# Patient Record
Sex: Female | Born: 1960 | Race: Black or African American | Hispanic: No | Marital: Married | Smoking: Former smoker
Health system: Southern US, Community
[De-identification: ages and names within clinical notes are randomized; demographics above are authoritative.]

## PROBLEM LIST (undated history)

## (undated) DIAGNOSIS — K219 Gastro-esophageal reflux disease without esophagitis: Secondary | ICD-10-CM

## (undated) DIAGNOSIS — D869 Sarcoidosis, unspecified: Secondary | ICD-10-CM

## (undated) DIAGNOSIS — D860 Sarcoidosis of lung: Secondary | ICD-10-CM

## (undated) DIAGNOSIS — L732 Hidradenitis suppurativa: Secondary | ICD-10-CM

## (undated) DIAGNOSIS — G589 Mononeuropathy, unspecified: Secondary | ICD-10-CM

## (undated) DIAGNOSIS — S149XXA Injury of unspecified nerves of neck, initial encounter: Secondary | ICD-10-CM

## (undated) HISTORY — PX: ABLATION ON ENDOMETRIOSIS: SHX5787

## (undated) HISTORY — PX: BREAST SURGERY: SHX581

## (undated) HISTORY — PX: LAPAROSCOPIC TUBAL LIGATION: SUR803

## (undated) HISTORY — DX: Sarcoidosis, unspecified: D86.9

## (undated) HISTORY — PX: OTHER SURGICAL HISTORY: SHX169

---

## 1996-01-30 DIAGNOSIS — D869 Sarcoidosis, unspecified: Secondary | ICD-10-CM

## 1996-01-30 HISTORY — DX: Sarcoidosis, unspecified: D86.9

## 1997-04-29 ENCOUNTER — Encounter: Payer: Self-pay | Admitting: Pulmonary Disease

## 1999-12-08 ENCOUNTER — Encounter: Payer: Self-pay | Admitting: Internal Medicine

## 1999-12-08 ENCOUNTER — Ambulatory Visit (HOSPITAL_COMMUNITY): Admission: RE | Admit: 1999-12-08 | Discharge: 1999-12-08 | Payer: Self-pay | Admitting: Internal Medicine

## 2000-01-09 ENCOUNTER — Ambulatory Visit: Admission: RE | Admit: 2000-01-09 | Discharge: 2000-01-09 | Payer: Self-pay | Admitting: Pulmonary Disease

## 2002-10-14 ENCOUNTER — Encounter: Payer: Self-pay | Admitting: Pulmonary Disease

## 2003-12-09 ENCOUNTER — Encounter: Admission: RE | Admit: 2003-12-09 | Discharge: 2003-12-09 | Payer: Self-pay | Admitting: Internal Medicine

## 2004-06-09 ENCOUNTER — Emergency Department (HOSPITAL_COMMUNITY): Admission: EM | Admit: 2004-06-09 | Discharge: 2004-06-09 | Payer: Self-pay | Admitting: Emergency Medicine

## 2004-08-10 ENCOUNTER — Encounter: Admission: RE | Admit: 2004-08-10 | Discharge: 2004-08-10 | Payer: Self-pay | Admitting: Orthopedic Surgery

## 2004-10-30 ENCOUNTER — Ambulatory Visit: Payer: Self-pay | Admitting: Pulmonary Disease

## 2004-12-16 ENCOUNTER — Encounter: Payer: Self-pay | Admitting: Pulmonary Disease

## 2004-12-19 ENCOUNTER — Ambulatory Visit: Payer: Self-pay | Admitting: Pulmonary Disease

## 2005-11-28 ENCOUNTER — Ambulatory Visit: Payer: Self-pay | Admitting: Pulmonary Disease

## 2007-01-28 ENCOUNTER — Encounter: Admission: RE | Admit: 2007-01-28 | Discharge: 2007-01-28 | Payer: Self-pay | Admitting: Obstetrics and Gynecology

## 2007-02-20 DIAGNOSIS — D869 Sarcoidosis, unspecified: Secondary | ICD-10-CM

## 2007-02-20 DIAGNOSIS — K219 Gastro-esophageal reflux disease without esophagitis: Secondary | ICD-10-CM

## 2007-02-20 HISTORY — DX: Gastro-esophageal reflux disease without esophagitis: K21.9

## 2007-02-20 HISTORY — DX: Sarcoidosis, unspecified: D86.9

## 2007-02-21 ENCOUNTER — Ambulatory Visit: Payer: Self-pay | Admitting: Pulmonary Disease

## 2007-02-28 ENCOUNTER — Ambulatory Visit: Payer: Self-pay | Admitting: Pulmonary Disease

## 2007-07-03 ENCOUNTER — Encounter: Admission: RE | Admit: 2007-07-03 | Discharge: 2007-07-03 | Payer: Self-pay | Admitting: Obstetrics and Gynecology

## 2007-11-18 ENCOUNTER — Telehealth (INDEPENDENT_AMBULATORY_CARE_PROVIDER_SITE_OTHER): Payer: Self-pay | Admitting: *Deleted

## 2008-01-12 ENCOUNTER — Encounter: Admission: RE | Admit: 2008-01-12 | Discharge: 2008-01-12 | Payer: Self-pay | Admitting: Obstetrics and Gynecology

## 2008-03-10 ENCOUNTER — Ambulatory Visit: Payer: Self-pay | Admitting: Pulmonary Disease

## 2008-04-15 ENCOUNTER — Ambulatory Visit: Payer: Self-pay | Admitting: Pulmonary Disease

## 2008-04-19 ENCOUNTER — Telehealth (INDEPENDENT_AMBULATORY_CARE_PROVIDER_SITE_OTHER): Payer: Self-pay | Admitting: *Deleted

## 2008-07-26 ENCOUNTER — Encounter: Admission: RE | Admit: 2008-07-26 | Discharge: 2008-07-26 | Payer: Self-pay | Admitting: Gastroenterology

## 2008-08-04 ENCOUNTER — Encounter: Admission: RE | Admit: 2008-08-04 | Discharge: 2008-08-04 | Payer: Self-pay | Admitting: Surgery

## 2008-08-27 ENCOUNTER — Ambulatory Visit (HOSPITAL_COMMUNITY): Admission: RE | Admit: 2008-08-27 | Discharge: 2008-08-27 | Payer: Self-pay | Admitting: Gastroenterology

## 2008-09-06 ENCOUNTER — Encounter: Admission: RE | Admit: 2008-09-06 | Discharge: 2008-09-06 | Payer: Self-pay | Admitting: Surgery

## 2008-09-10 ENCOUNTER — Encounter: Admission: RE | Admit: 2008-09-10 | Discharge: 2008-09-10 | Payer: Self-pay | Admitting: Surgery

## 2008-09-17 ENCOUNTER — Encounter: Admission: RE | Admit: 2008-09-17 | Discharge: 2008-09-17 | Payer: Self-pay | Admitting: Surgery

## 2008-09-21 ENCOUNTER — Other Ambulatory Visit: Admission: RE | Admit: 2008-09-21 | Discharge: 2008-09-21 | Payer: Self-pay | Admitting: Radiology

## 2008-09-21 ENCOUNTER — Encounter: Admission: RE | Admit: 2008-09-21 | Discharge: 2008-09-21 | Payer: Self-pay | Admitting: Surgery

## 2008-09-21 ENCOUNTER — Encounter (INDEPENDENT_AMBULATORY_CARE_PROVIDER_SITE_OTHER): Payer: Self-pay | Admitting: Radiology

## 2008-11-16 ENCOUNTER — Ambulatory Visit: Payer: Self-pay | Admitting: Pulmonary Disease

## 2009-03-17 ENCOUNTER — Ambulatory Visit: Payer: Self-pay | Admitting: Pulmonary Disease

## 2009-04-07 ENCOUNTER — Ambulatory Visit: Payer: Self-pay | Admitting: Pulmonary Disease

## 2009-09-12 ENCOUNTER — Ambulatory Visit (HOSPITAL_COMMUNITY): Admission: RE | Admit: 2009-09-12 | Discharge: 2009-09-12 | Payer: Self-pay | Admitting: Obstetrics and Gynecology

## 2009-10-04 ENCOUNTER — Encounter: Admission: RE | Admit: 2009-10-04 | Discharge: 2009-10-04 | Payer: Self-pay | Admitting: Surgery

## 2009-11-14 ENCOUNTER — Ambulatory Visit (HOSPITAL_BASED_OUTPATIENT_CLINIC_OR_DEPARTMENT_OTHER): Admission: RE | Admit: 2009-11-14 | Discharge: 2009-11-14 | Payer: Self-pay | Admitting: Surgery

## 2009-12-29 ENCOUNTER — Ambulatory Visit: Payer: Self-pay | Admitting: Pulmonary Disease

## 2010-02-27 ENCOUNTER — Telehealth: Payer: Self-pay | Admitting: Pulmonary Disease

## 2010-02-28 NOTE — Assessment & Plan Note (Signed)
Summary: flu shot ///kp  Nurse Visit   Allergies: 1)  ! Codeine  Orders Added: 1)  Admin 1st Vaccine [90471] 2)  Flu Vaccine 36yrs + [16109] Flu Vaccine Consent Questions     Do you have a history of severe allergic reactions to this vaccine? no    Any prior history of allergic reactions to egg and/or gelatin? no    Do you have a sensitivity to the preservative Thimersol? no    Do you have a past history of Guillan-Barre Syndrome? no    Do you currently have an acute febrile illness? no    Have you ever had a severe reaction to latex? no    Vaccine information given and explained to patient? yes    Are you currently pregnant? no    Lot Number:[id number]   Exp Date:07/29/2010   Site Given  Left Deltoid IM   Tammy Scott  December 29, 2009 1:34 PM

## 2010-02-28 NOTE — Assessment & Plan Note (Signed)
Summary: yearly f/u for sarcoidosis   Copy to:  [facility] Primary Provider/Referring Provider:  Kellie Shropshire  CC:  Pt is here for a 1 yr f/u appt.  Pt states breathing has "changed."  Pt c/o increased sob with exertion.  Pt states she recently had cpx with pcp and also had lab work and ACE level was 102.  Pt also c/o non-productive cough.  .  History of Present Illness: The pt is a 50y/o female who comes in today for f/u of her known sarcoid.  We have been treating her conservatively with ICS, and following yearly pfts/cxr.  She comes in for her yearly visit, and has noted a worsening in her exertional tolerance above her usual baseline.  She denies any significant cough, but does have a mild dry cough.  She tells me that she has had an ACE level done which was over 100.  She denies any constitutional symptoms or significant joint aches.  She has had an endoscopy where they saw "bumps", and I am in the process of getting records from Dr. Loreta Ave.    Current Medications (verified): 1)  Advair Diskus 250-50 Mcg/dose Misc (Fluticasone-Salmeterol) .... Inhale 1 Puff As Directed Twice A Day 2)  Prevacid 24hr 15 Mg Cpdr (Lansoprazole) .... Take 1 Tablet By Mouth Once A Day As Needed 3)  Claritin-D 12 Hour 5-120 Mg Xr12h-Tab (Loratadine-Pseudoephedrine) .... As Needed 4)  Proair Inhaler .... 2 Puffs Every 4 Hrs If Needed. 5)  Lovaza 1 Gm Caps (Omega-3-Acid Ethyl Esters) .... Take 1 Tablet By Mouth Two Times A Day 6)  Folast 2.8-25-2 Mg Tabs (L-Methylfolate-B6-B12) .... Take 1 Tablet By Mouth Once A Day  Allergies (verified): 1)  ! Codeine  Review of Systems      See HPI  Vital Signs:  Patient profile:   51 year old female Height:      68 inches Weight:      171.13 pounds BMI:     26.11 Cuff size:   regular  Vitals Entered By: Arman Filter LPN (March 17, 2009 10:19 AM)  O2 Flow:  Room air CC: Pt is here for a 1 yr f/u appt.  Pt states breathing has "changed."  Pt c/o increased sob with  exertion.  Pt states she recently had cpx with pcp and also had lab work and ACE level was 102.  Pt also c/o non-productive cough.   Comments Medications reviewed with patient Arman Filter LPN  March 17, 2009 10:19 AM    Physical Exam  General:  wd female in nad Nose:  no obvious discharge Lungs:  totally clear to ascultation Heart:  rrr, no mrg Extremities:  no edema noted or cyanosis Neurologic:  alert and oriented, moves all 4.   Impression & Recommendations:  Problem # 1:  SARCOIDOSIS, PULMONARY (ICD-135) the pt has known sarcoidosis with stable pfts and cxr over the years on ICS.  She is now having increased doe that may be related to sarcoid and may not.  She is due for her f/u pfts and cxr, and I would like to see these before making any changes in her treatment.  Would also like to review GI records.  If there is significant change, she will need a course of prednisone.  If no change, she will need to decide if her symptoms are significant enough to justify the side effects of prednisone.  I will call her with results.  Medications Added to Medication List This Visit: 1)  Prevacid 24hr 15  Mg Cpdr (Lansoprazole) .... Take 1 tablet by mouth once a day as needed 2)  Claritin-d 12 Hour 5-120 Mg Xr12h-tab (Loratadine-pseudoephedrine) .... As needed 3)  Proair Inhaler  .... 2 puffs every 4 hrs if needed. 4)  Proair Hfa 108 (90 Base) Mcg/act Aers (Albuterol sulfate) .Marland Kitchen.. 1-2 puffs every 4-6 hours as needed 5)  Lovaza 1 Gm Caps (Omega-3-acid ethyl esters) .... Take 1 tablet by mouth two times a day 6)  Folast 2.8-25-2 Mg Tabs (L-methylfolate-b6-b12) .... Take 1 tablet by mouth once a day  Other Orders: Est. Patient Level III (16109) T-2 View CXR (71020TC) Pulmonary Referral (Pulmonary)  Patient Instructions: 1)  will check cxr today and schedule for pfts.  I will call you with results. 2)  will get path report from Dr Loreta Ave   Prescriptions: PROAIR HFA 108 (90 BASE) MCG/ACT   AERS (ALBUTEROL SULFATE) 1-2 puffs every 4-6 hours as needed  #3 x 3   Entered by:   Arman Filter LPN   Authorized by:   Barbaraann Share MD   Signed by:   Arman Filter LPN on 60/45/4098   Method used:   Electronically to        [facility]* (mail-order)             ,          Ph: [phone]       Fax: [phone]   RxID:   [id number] ADVAIR DISKUS 250-50 MCG/DOSE MISC (FLUTICASONE-SALMETEROL) Inhale 1 puff as directed twice a day  #3 x 3   Entered by:   Arman Filter LPN   Authorized by:   Barbaraann Share MD   Signed by:   Arman Filter LPN on 11/25/2534   Method used:   Electronically to        [facility] [facility]* (mail-order)             ,          Ph: [phone]       Fax: [phone]   RxID:   [id number]

## 2010-02-28 NOTE — Assessment & Plan Note (Signed)
Summary: rov for sarcoidosis   Copy to:  [facility] Primary Provider/Referring Provider:  Kellie Shropshire  CC:  Pt is here for a f/u appt to discuss PFT results.   Pt states breathing is unchanged from last visit.  Marland Kitchen  History of Present Illness: The pt comes in today to review her cxr and pft results.  She was found to have no obstruction, mild restriction, and a mild decrease in DLCO.  There was no change from one year ago.  Her cxr was also completely stable.  I have reviewed the studies with her in detail, and answered all of her questions.  Medications Prior to Update: 1)  Advair Diskus 250-50 Mcg/dose Misc (Fluticasone-Salmeterol) .... Inhale 1 Puff As Directed Twice A Day 2)  Prevacid 24hr 15 Mg Cpdr (Lansoprazole) .... Take 1 Tablet By Mouth Once A Day As Needed 3)  Claritin-D 12 Hour 5-120 Mg Xr12h-Tab (Loratadine-Pseudoephedrine) .... As Needed 4)  Proair Hfa 108 (90 Base) Mcg/act  Aers (Albuterol Sulfate) .Marland Kitchen.. 1-2 Puffs Every 4-6 Hours As Needed 5)  Lovaza 1 Gm Caps (Omega-3-Acid Ethyl Esters) .... Take 1 Tablet By Mouth Two Times A Day 6)  Folast 2.8-25-2 Mg Tabs (L-Methylfolate-B6-B12) .... Take 1 Tablet By Mouth Once A Day  Allergies (verified): 1)  ! Codeine  Vital Signs:  Patient profile:   50 year old female Height:      69 inches Weight:      171 pounds BMI:     25.34 O2 Sat:      96 % on Room air Temp:     98.1 degrees F oral Pulse rate:   90 / minute BP sitting:   120 / 84  (right arm) Cuff size:   regular  Vitals Entered By: Arman Filter LPN (April 07, 2009 9:54 AM)  O2 Flow:  Room air CC: Pt is here for a f/u appt to discuss PFT results.   Pt states breathing is unchanged from last visit.   Comments Medications reviewed with patient Arman Filter LPN  April 07, 2009 9:55 AM    Physical Exam  General:  wd female in nad   Impression & Recommendations:  Problem # 1:  SARCOIDOSIS, PULMONARY (ICD-135) the pt's cxr and pfts are completely stable from a year  ago.  The pt does not feel that any of her symptoms are signficant enough at this time to justify prednisone.  I would agree.  She will f/u with me in one year, or sooner if symptoms worsen.  Other Orders: Est. Patient Level II (04540)  Patient Instructions: 1)  followup with me in one year.  Please remind [place] a month ahead of time to schedule for full breathing studies and cxr the day of your apptm.

## 2010-02-28 NOTE — Miscellaneous (Signed)
Summary: Orders Update pft charges  Clinical Lists Changes  Orders: Added new Service order of Carbon Monoxide diffusing w/capacity (94720) - Signed Added new Service order of Lung Volumes (94240) - Signed Added new Service order of Spirometry (Pre & Post) (94060) - Signed 

## 2010-03-08 NOTE — Progress Notes (Signed)
Summary: wants to know if pft and xrays can be scheduled-  Phone Note Call from Patient   Caller: Patient Call For: Stefanie Orozco Summary of Call: Patient phoned to schedule her 1 year rov stated that she usually has to have a chest xray and PFT wants to know if she can do this all the same day instead of having to come back another day. I have her follow up scheduled for 04/10/10 at 3:30pm. Ms Gierke [phone] Initial call taken by: Vedia Coffer,  February 27, 2010 2:22 PM  Follow-up for Phone Call        Pt had last PFT done March 2011.  She has ov coming up next month and wants to know if she needs to have PFTs done again. Pls advise thanks! Follow-up by: Vernie Murders,  February 27, 2010 3:51 PM  Additional Follow-up for Phone Call Additional follow up Details #1::        see if we can get her scheduled for full pfts same day as ov Additional Follow-up by: Barbaraann Share MD,  February 27, 2010 5:17 PM    Additional Follow-up for Phone Call Additional follow up Details #2::    LMTCBx1 there are appts available for pft and appt that day. Carron Curie CMA  February 27, 2010 5:24 PM  lmomtcb x2 Carver Fila  February 28, 2010 5:16 PM   spoke with pt and pft set for 04-10-10 at 1 pm and appt with [facility] at 2:30pm.Jennifer [facility] CMA  February 28, 2010 5:27 PM

## 2010-03-23 ENCOUNTER — Telehealth (INDEPENDENT_AMBULATORY_CARE_PROVIDER_SITE_OTHER): Payer: Self-pay | Admitting: *Deleted

## 2010-03-28 NOTE — Progress Notes (Signed)
Summary: samples  Phone Note Call from Patient Call back at Work Phone [phone]   Caller: Patient Call For: clance Summary of Call: Pt wants samples of advair diskus 250-52mcg. Initial call taken by: Darletta Moll,  March 23, 2010 8:27 AM  Follow-up for Phone Call        Spoke with pt.  She is requesting sample of advair while waiting for her rx to come in the mail.  1 box left up front. Reminded her to keep pending appt and she verbalized understanding.  Follow-up by: Vernie Murders,  March 23, 2010 9:39 AM

## 2010-04-10 ENCOUNTER — Encounter (INDEPENDENT_AMBULATORY_CARE_PROVIDER_SITE_OTHER): Payer: 59

## 2010-04-10 ENCOUNTER — Encounter: Payer: Self-pay | Admitting: Pulmonary Disease

## 2010-04-10 ENCOUNTER — Other Ambulatory Visit: Payer: Self-pay | Admitting: Pulmonary Disease

## 2010-04-10 ENCOUNTER — Ambulatory Visit: Payer: Self-pay | Admitting: Pulmonary Disease

## 2010-04-10 ENCOUNTER — Ambulatory Visit (INDEPENDENT_AMBULATORY_CARE_PROVIDER_SITE_OTHER)
Admission: RE | Admit: 2010-04-10 | Discharge: 2010-04-10 | Disposition: A | Discharge status: Home / Self Care | Payer: 59 | Source: Ambulatory Visit | Attending: Pulmonary Disease | Admitting: Pulmonary Disease

## 2010-04-10 ENCOUNTER — Ambulatory Visit (INDEPENDENT_AMBULATORY_CARE_PROVIDER_SITE_OTHER): Payer: Managed Care, Other (non HMO) | Admitting: Pulmonary Disease

## 2010-04-10 DIAGNOSIS — D869 Sarcoidosis, unspecified: Secondary | ICD-10-CM

## 2010-04-12 LAB — POCT HEMOGLOBIN-HEMACUE: Hemoglobin: 13.7 g/dL (ref 12.0–15.0)

## 2010-04-14 LAB — CBC
HCT: 38.3 % (ref 36.0–46.0)
Hemoglobin: 12.8 g/dL (ref 12.0–15.0)
MCH: 33.9 pg (ref 26.0–34.0)
MCHC: 33.4 g/dL (ref 30.0–36.0)
MCV: 101.5 fL — ABNORMAL HIGH (ref 78.0–100.0)
Platelets: 283 10*3/uL (ref 150–400)
RBC: 3.78 MIL/uL — ABNORMAL LOW (ref 3.87–5.11)
RDW: 13.1 % (ref 11.5–15.5)
WBC: 4.1 10*3/uL (ref 4.0–10.5)

## 2010-04-18 NOTE — Letter (Signed)
Summary: [employer]  [address]   [place], [place] [address]   Phone: [phone]  Fax: [phone]    Today's Date: April 10, 2010  Name of Patient: Stefanie Orozco  The above named patient had a medical visit today at: 2:30 pm.  Please take this into consideration when reviewing the time away from work/school.    Special Instructions:  [ x ] None  [  ] To be off the remainder of today, returning to the normal work / school schedule tomorrow.  [  ] To be off until the next scheduled appointment on ______________________.  [  ] Other ________________________________________________________________ ________________________________________________________________________   Sincerely yours,     Marcelyn Bruins, MD

## 2010-04-18 NOTE — Assessment & Plan Note (Signed)
Summary: pft charges   Allergies: 1)  Codeine   Other Orders: Carbon Monoxide diffusing w/capacity (94729) Lung Volumes/Gas dilution or washout (94727) Spirometry (Pre & Post) (94060) 

## 2010-04-18 NOTE — Assessment & Plan Note (Signed)
Summary: rov for sarcoidosis    Primary Provider/Referring Provider:  Kellie Shropshire  CC:  1 year f/u appt.  Pt had PFTs and CXR prior to appt.   Pt states she was dx with a skin condition called hydronitis.   Pt denies sob or cough.  Pt does c/o difficulty swallowing.  Pt requesting 90 day supply of Advair and Proair. Marland Kitchen  History of Present Illness: the pt comes in today for f/u of her known sarcoidosis.  She is maintained on advair for sarcoid induced cough, and has done well with this over the years.  She is having some swallowing issues, and wonder if this is due to particle size of advair?  She had a f/u cxr today that is completely stable, and had pfts which show stable numbers as well.  She denies any change in her exertional tolerance, and has no ongoing congestion or cough.  Preventive Screening-Counseling & Management  Alcohol-Tobacco     Smoking Status: quit  Current Medications (verified): 1)  Advair Diskus 250-50 Mcg/dose Misc (Fluticasone-Salmeterol) .... Inhale 1 Puff As Directed Twice A Day 2)  Prevacid 24hr 15 Mg Cpdr (Lansoprazole) .... Take 1 Tablet By Mouth Once A Day As Needed 3)  Claritin-D 12 Hour 5-120 Mg Xr12h-Tab (Loratadine-Pseudoephedrine) .... As Needed 4)  Proair Hfa 108 (90 Base) Mcg/act  Aers (Albuterol Sulfate) .Marland Kitchen.. 1-2 Puffs Every 4-6 Hours As Needed 5)  Lovaza 1 Gm Caps (Omega-3-Acid Ethyl Esters) .... Take 1 Tablet By Mouth Two Times A Day 6)  Bioidentical Progesterone .... Once Daily 7)  Dhea  Allergies (verified): 1)  ! Codeine  Past History:  Past Medical History: sarcoid--dxed 1998 in [place] on clinical grounds.  ACE > 400, compatible cxr   Social History: Patient states former smoker.  quit smoking approx 1987. Smoking Status:  quit  Review of Systems       The patient complains of acid heartburn, indigestion, weight change, difficulty swallowing, headaches, and nasal congestion/difficulty breathing through nose.  The patient denies  shortness of breath with activity, shortness of breath at rest, productive cough, non-productive cough, coughing up blood, chest pain, irregular heartbeats, loss of appetite, abdominal pain, sore throat, tooth/dental problems, sneezing, itching, ear ache, anxiety, depression, hand/feet swelling, joint stiffness or pain, rash, change in color of mucus, and fever.    Vital Signs:  Patient profile:   50 year old female Height:      69 inches Weight:      157 pounds BMI:     23.27 O2 Sat:      99 % on Room air Temp:     97.6 degrees F oral Pulse rate:   87 / minute BP sitting:   120 / 70  (left arm) Cuff size:   regular  Vitals Entered By: Arman Filter LPN (April 10, 2010 2:03 PM)  O2 Flow:  Room air CC: 1 year f/u appt.  Pt had PFTs and CXR prior to appt.   Pt states she was dx with a skin condition called hydronitis.   Pt denies sob or cough.  Pt does c/o difficulty swallowing.  Pt requesting 90 day supply of Advair and Proair.  Comments Medications reviewed with patient Arman Filter LPN  April 10, 2010 2:03 PM    Physical Exam  General:  wd female in nad. Nose:  no purulence or discharge noted.   Lungs:  totally clear to auscultation, no wheezing Heart:  rrr,no mrg  Extremities:  no edema or cyanosis  Neurologic:  alert and oriented, moves all 4    Impression & Recommendations:  Problem # 1:  SARCOIDOSIS, PULMONARY (ICD-135) the pt is doing very well from pulmonary standpoint, with no ongoing issues.  She is on advair for sarcoid related cough, but is having some mild swallowing issues.  ? due to advair.  Will try dulera in its place for a period of time, and she will let us know if it makes a difference.  Her pfts and cxr today are totally stable.  Medications Added to Medication List This Visit: 1)  Bioidentical Progesterone  .... Once daily 2)  Dhea   Other Orders: T-2 View CXR (71020TC) Est. Patient Level III (96295)  Patient Instructions: 1)  will try dulera  100/5  2 inhalations am and pm...rinse mouth well. 2)  stay active  3)  followup with me in one year, or call if having issues.

## 2010-05-31 ENCOUNTER — Telehealth: Payer: Self-pay | Admitting: Pulmonary Disease

## 2010-05-31 NOTE — Telephone Encounter (Signed)
Did her swallowing issues improve on dulera?  That is why we changed.

## 2010-05-31 NOTE — Telephone Encounter (Signed)
Pt states she is doing well on Dulera. No noticeable improvement compared to Advair. She is requesting a 90 day supply for [facility] and Proair be sent to [facility]. Pls advise if you want pt to continue on Dulera.

## 2010-06-01 NOTE — Telephone Encounter (Signed)
Spoke w/ pt and she states the dulera did not improve her swallowing issues. Pt states she is still having same swallowing problems. Please advise Dr. Shelle Iron. Thanks  Carver Fila, CMA

## 2010-06-01 NOTE — Telephone Encounter (Signed)
Pt phoned stated she was returning a call to triage she can be reached at  [phone].Vedia Coffer

## 2010-06-01 NOTE — Telephone Encounter (Signed)
lmomtcb x1 

## 2010-06-05 MED ORDER — ALBUTEROL SULFATE HFA 108 (90 BASE) MCG/ACT IN AERS: INHALATION_SPRAY | RESPIRATORY_TRACT | Status: DC

## 2010-06-05 MED ORDER — MOMETASONE FURO-FORMOTEROL FUM 100-5 MCG/ACT IN AERO: INHALATION_SPRAY | RESPIRATORY_TRACT | Status: DC

## 2010-06-05 NOTE — Telephone Encounter (Signed)
Spoke w/ pt and she states she would like to continue on [place]. Pt states it is much more friendly user and she felt like it helped her better than the advair. Pt states she also needed her rescue inhaler sent to [facility] as well. I advised pt rx's was sent to [facility] and nothing further was needed

## 2010-06-05 NOTE — Telephone Encounter (Signed)
She had been on advair previously, and changed to dulera to see if swallowing issues improved.  If they did not, give her the choice of staying on advair vs changing to dulera.  If she wants to stay on dulera, ok to send script.

## 2010-06-14 ENCOUNTER — Telehealth: Payer: Self-pay | Admitting: Pulmonary Disease

## 2010-06-14 NOTE — Telephone Encounter (Signed)
Spoke w/ pt and she states insurance had put a hold on her medication dulera and she could not get it sent to her. Pt states the pharmacy had filed it under wrong insurance. Pt states she got it fixed and the pharmacy advised her it would take up to a week before she will get her medication. Pt states she is completely out and is wanting a sample. I asked Dr. Shelle Iron and he gave the okay if we had any to give. Advised pt 1 sample of dulera 100/5 was left upfront for her to pick up. Pt verbalized understanding and needed nothing else further  Carver Fila, CMA

## 2010-09-27 ENCOUNTER — Encounter: Payer: Self-pay | Admitting: *Deleted

## 2010-09-27 ENCOUNTER — Telehealth: Payer: Self-pay | Admitting: Pulmonary Disease

## 2010-09-27 NOTE — Telephone Encounter (Signed)
Spoke with pt and she c/o dry cough and congestion. Pt went to the outpaitent clinic and was given a pred pak and benzonatate and advised to f/u with [place]. Since [facility] if booked tomorrow pt agree'd to come in and see TP at 9:15. Nothing further was needed

## 2010-09-28 ENCOUNTER — Encounter: Payer: Self-pay | Admitting: Adult Health

## 2010-09-28 ENCOUNTER — Ambulatory Visit (INDEPENDENT_AMBULATORY_CARE_PROVIDER_SITE_OTHER): Payer: Managed Care, Other (non HMO) | Admitting: Adult Health

## 2010-09-28 ENCOUNTER — Encounter: Payer: Self-pay | Admitting: *Deleted

## 2010-09-28 VITALS — BP 114/80 | HR 80 | Temp 97.2°F | Ht 68.0 in | Wt 161.2 lb

## 2010-09-28 DIAGNOSIS — J40 Bronchitis, not specified as acute or chronic: Secondary | ICD-10-CM | POA: Insufficient documentation

## 2010-09-28 MED ORDER — AZITHROMYCIN 250 MG PO TABS: ORAL_TABLET | ORAL | Status: AC

## 2010-09-28 MED ORDER — HYDROCODONE-HOMATROPINE 5-1.5 MG/5ML PO SYRP: 5.0000 mL | ORAL_SOLUTION | Freq: Four times a day (QID) | ORAL | Status: AC | PRN

## 2010-09-28 NOTE — Assessment & Plan Note (Signed)
Slow to resolve bronchitic flare /URI w/ underlying sarcoid   Plan:  Taper Prednisone 10mg  4 tabs for 2 days, then 3 tabs for 2 days, 2 tabs for 2 days, then 1 tab for 2 days, then stop Mucinex DM Twice daily  As needed  Cough/congestion  Fluids and rest.  Claritin As needed  daily for drainage.  Salt water gargles As needed   Hydromet 1-2 tsp every 4 hrs As needed  Cough, may make you sleepy.  Zpack to have on hold if symptoms worsen with discolored mucus  Please contact office for sooner follow up if symptoms do not improve or worsen or seek emergency care

## 2010-09-28 NOTE — Patient Instructions (Signed)
Taper Prednisone 10mg  4 tabs for 2 days, then 3 tabs for 2 days, 2 tabs for 2 days, then 1 tab for 2 days, then stop Mucinex DM Twice daily  As needed  Cough/congestion  Fluids and rest.  Claritin As needed  daily for drainage.  Salt water gargles As needed   Hydromet 1-2 tsp every 4 hrs As needed  Cough, may make you sleepy.  Zpack to have on hold if symptoms worsen with discolored mucus  Please contact office for sooner follow up if symptoms do not improve or worsen or seek emergency care

## 2010-09-28 NOTE — Progress Notes (Signed)
  Subjective:    Patient ID: Stefanie Orozco, female    DOB: August 06, 1960, 50 y.o.   MRN: [id number]  HPI 50 yo female with known hx of Sarcoid sarcoid--dxed 1998 in [place] on clinical grounds. ACE > 400, compatible cxr  09/28/2010 Follow up  Presents for an acute office visit. Complains of hoarseness, dry cough, head and ear congestion, sorethroat, SOB and some wheezing for 10 days . Seen at Urgent Care on 8/27, was given Neb tx. Rx steroid taper and tessalon. No abx rx was given.  Returns today some better but still has cough. Was placed on high dose steroids at 60mg  w/ slow taper over next 10 days . This is causing her nausea and trouble sleeping. No hemoptysis or dyspnea. No leg swelling  Or rash.   Review of Systems Constitutional:   No  weight loss, night sweats,  Fevers, chills, fatigue, or  lassitude.  HEENT:   No headaches,  Difficulty swallowing,  Tooth/dental problems, or  Sore throat,                No sneezing, itching, ear ache, nasal congestion, post nasal drip,   CV:  No chest pain,  Orthopnea, PND, swelling in lower extremities, anasarca, dizziness, palpitations, syncope.   GI  No heartburn, indigestion, abdominal pain, nausea, vomiting, diarrhea, change in bowel habits, loss of appetite, bloody stools.   Resp: No shortness of breath with exertion or at rest.   No coughing up of blood.  No change in color of mucus.     No chest wall deformity  Skin: no rash or lesions.  GU: no dysuria, change in color of urine, no urgency or frequency.  No flank pain, no hematuria   MS:  No joint pain or swelling.  No decreased range of motion.  No back pain.  Psych:  No change in mood or affect. No depression or anxiety.  No memory loss.         Objective:   Physical Exam GEN: A/Ox3; pleasant , NAD, well nourished   HEENT:  [place]/AT,  EACs-clear, TMs-wnl, NOSE-clear, THROAT-clear, no lesions, no postnasal drip or exudate noted.   NECK:  Supple w/ fair ROM; no JVD; normal  carotid impulses w/o bruits; no thyromegaly or nodules palpated; no lymphadenopathy.  RESP  Coarse BS w/o, wheezes/ rales/ or rhonchi.no accessory muscle use, no dullness to percussion  CARD:  RRR, no m/r/g  , no peripheral edema, pulses intact, no cyanosis or clubbing.  GI:   Soft & nt; nml bowel sounds; no organomegaly or masses detected.  Musco: Warm bil, no deformities or joint swelling noted.   Neuro: alert, no focal deficits noted.    Skin: Warm, no lesions or rashes         Assessment & Plan:

## 2010-09-28 NOTE — Progress Notes (Signed)
vist reviewed, and agree with plan as outlined.

## 2010-10-25 ENCOUNTER — Other Ambulatory Visit: Payer: Self-pay | Admitting: Obstetrics and Gynecology

## 2010-10-25 DIAGNOSIS — Z1231 Encounter for screening mammogram for malignant neoplasm of breast: Secondary | ICD-10-CM

## 2010-11-06 ENCOUNTER — Ambulatory Visit
Admission: RE | Admit: 2010-11-06 | Discharge: 2010-11-06 | Disposition: A | Discharge status: Home / Self Care | Payer: Managed Care, Other (non HMO) | Source: Ambulatory Visit | Attending: Obstetrics and Gynecology | Admitting: Obstetrics and Gynecology

## 2010-11-06 DIAGNOSIS — Z1231 Encounter for screening mammogram for malignant neoplasm of breast: Secondary | ICD-10-CM

## 2010-12-26 ENCOUNTER — Ambulatory Visit (INDEPENDENT_AMBULATORY_CARE_PROVIDER_SITE_OTHER): Payer: Managed Care, Other (non HMO)

## 2010-12-26 DIAGNOSIS — Z23 Encounter for immunization: Secondary | ICD-10-CM

## 2011-04-09 ENCOUNTER — Encounter: Payer: Self-pay | Admitting: Pulmonary Disease

## 2011-04-09 ENCOUNTER — Ambulatory Visit (INDEPENDENT_AMBULATORY_CARE_PROVIDER_SITE_OTHER): Payer: Federal, State, Local not specified - PPO | Admitting: Pulmonary Disease

## 2011-04-09 VITALS — BP 110/80 | HR 73 | Temp 98.5°F | Ht 68.0 in | Wt 163.8 lb

## 2011-04-09 DIAGNOSIS — D869 Sarcoidosis, unspecified: Secondary | ICD-10-CM

## 2011-04-09 MED ORDER — MOMETASONE FURO-FORMOTEROL FUM 100-5 MCG/ACT IN AERO: INHALATION_SPRAY | RESPIRATORY_TRACT | Status: DC

## 2011-04-09 MED ORDER — ALBUTEROL SULFATE HFA 108 (90 BASE) MCG/ACT IN AERS: INHALATION_SPRAY | RESPIRATORY_TRACT | Status: DC

## 2011-04-09 NOTE — Progress Notes (Signed)
  Subjective:    Patient ID: Stefanie Orozco, female    DOB: [birth year], 50 y.o.   MRN: [id number]  HPI The patient comes in today for followup of her known sarcoidosis.  This is manifested primarily as cough, and she has responded well to dulera.  She has not seen any change in her exertional tolerance, and feels that her cough is well controlled.  She is having a lot of sinus issues that are being managed by her primary care physician.   Review of Systems  Constitutional: Negative for fever and unexpected weight change.  HENT: Positive for congestion, sore throat, rhinorrhea, sneezing, trouble swallowing, postnasal drip and sinus pressure. Negative for ear pain, nosebleeds and dental problem.   Eyes: Negative for redness and itching.  Respiratory: Positive for cough and wheezing. Negative for chest tightness and shortness of breath.   Cardiovascular: Negative for palpitations and leg swelling.  Gastrointestinal: Negative for nausea and vomiting.  Genitourinary: Negative for dysuria.  Musculoskeletal: Negative for joint swelling.  Skin: Negative for rash.  Neurological: Positive for headaches.  Hematological: Bruises/bleeds easily.  Psychiatric/Behavioral: Negative for dysphoric mood. The patient is not nervous/anxious.        Objective:   Physical Exam Well-developed female in no acute distress Nose without purulence or discharge noted Chest totally clear to auscultation Cardiac exam with regular rate and rhythm Lower extremities without edema, no cyanosis Alert and oriented, moves all 4 extremities.       Assessment & Plan:

## 2011-04-09 NOTE — Patient Instructions (Signed)
Continue on dulera Will see back in one year, and will do cxr and pfts same day  Please call if any change in symptoms.

## 2011-04-09 NOTE — Assessment & Plan Note (Signed)
The patient is doing well from a pulmonary standpoint on her current regimen.  Her cough is well controlled, and she has not seen any change in her exertional tolerance.  Since she is doing so well, I will hold off on a chest x-ray and PFTs at this visit, and we'll schedule for her followup next year.  She is to call me if her symptoms change in the interim.

## 2011-09-17 ENCOUNTER — Other Ambulatory Visit: Payer: Self-pay | Admitting: Obstetrics and Gynecology

## 2011-09-17 DIAGNOSIS — N644 Mastodynia: Secondary | ICD-10-CM

## 2011-09-17 DIAGNOSIS — R922 Inconclusive mammogram: Secondary | ICD-10-CM

## 2011-09-17 DIAGNOSIS — L539 Erythematous condition, unspecified: Secondary | ICD-10-CM

## 2011-09-25 ENCOUNTER — Ambulatory Visit
Admission: RE | Admit: 2011-09-25 | Discharge: 2011-09-25 | Disposition: A | Discharge status: Home / Self Care | Payer: Federal, State, Local not specified - PPO | Source: Ambulatory Visit | Attending: Obstetrics and Gynecology | Admitting: Obstetrics and Gynecology

## 2011-09-25 ENCOUNTER — Other Ambulatory Visit: Payer: Self-pay | Admitting: Obstetrics and Gynecology

## 2011-09-25 DIAGNOSIS — L539 Erythematous condition, unspecified: Secondary | ICD-10-CM

## 2011-09-25 DIAGNOSIS — N644 Mastodynia: Secondary | ICD-10-CM

## 2011-09-25 DIAGNOSIS — R922 Inconclusive mammogram: Secondary | ICD-10-CM

## 2011-11-14 ENCOUNTER — Other Ambulatory Visit: Payer: Self-pay | Admitting: Obstetrics and Gynecology

## 2011-11-14 ENCOUNTER — Ambulatory Visit (INDEPENDENT_AMBULATORY_CARE_PROVIDER_SITE_OTHER): Payer: Federal, State, Local not specified - PPO

## 2011-11-14 DIAGNOSIS — Z23 Encounter for immunization: Secondary | ICD-10-CM

## 2011-11-14 DIAGNOSIS — Z1231 Encounter for screening mammogram for malignant neoplasm of breast: Secondary | ICD-10-CM

## 2011-11-15 DIAGNOSIS — Z23 Encounter for immunization: Secondary | ICD-10-CM

## 2011-12-06 ENCOUNTER — Ambulatory Visit
Admission: RE | Admit: 2011-12-06 | Discharge: 2011-12-06 | Disposition: A | Discharge status: Home / Self Care | Payer: Federal, State, Local not specified - PPO | Source: Ambulatory Visit | Attending: Obstetrics and Gynecology | Admitting: Obstetrics and Gynecology

## 2011-12-06 DIAGNOSIS — Z1231 Encounter for screening mammogram for malignant neoplasm of breast: Secondary | ICD-10-CM

## 2012-04-07 ENCOUNTER — Other Ambulatory Visit: Payer: Self-pay | Admitting: Pulmonary Disease

## 2012-04-07 DIAGNOSIS — D869 Sarcoidosis, unspecified: Secondary | ICD-10-CM

## 2012-04-08 ENCOUNTER — Ambulatory Visit: Payer: Federal, State, Local not specified - PPO | Admitting: Pulmonary Disease

## 2012-04-25 ENCOUNTER — Other Ambulatory Visit: Payer: Self-pay | Admitting: Pulmonary Disease

## 2012-04-30 ENCOUNTER — Telehealth: Payer: Self-pay | Admitting: Pulmonary Disease

## 2012-04-30 MED ORDER — MOMETASONE FURO-FORMOTEROL FUM 100-5 MCG/ACT IN AERO: INHALATION_SPRAY | RESPIRATORY_TRACT | Status: DC

## 2012-04-30 NOTE — Telephone Encounter (Signed)
Pt came in for samples. I gave the pt one sample along with patient assistance forms to complete. I advised the pt that we would not be able to give more samples in the future. Carron Curie, CMA

## 2012-05-09 ENCOUNTER — Encounter: Payer: Federal, State, Local not specified - PPO | Admitting: Pulmonary Disease

## 2012-05-09 ENCOUNTER — Ambulatory Visit: Payer: Federal, State, Local not specified - PPO | Admitting: Pulmonary Disease

## 2012-06-04 ENCOUNTER — Ambulatory Visit (INDEPENDENT_AMBULATORY_CARE_PROVIDER_SITE_OTHER)
Admission: RE | Admit: 2012-06-04 | Discharge: 2012-06-04 | Disposition: A | Discharge status: Home / Self Care | Payer: Federal, State, Local not specified - PPO | Source: Ambulatory Visit | Attending: Pulmonary Disease | Admitting: Pulmonary Disease

## 2012-06-04 ENCOUNTER — Ambulatory Visit (INDEPENDENT_AMBULATORY_CARE_PROVIDER_SITE_OTHER): Payer: Federal, State, Local not specified - PPO | Admitting: Pulmonary Disease

## 2012-06-04 ENCOUNTER — Other Ambulatory Visit: Payer: Self-pay | Admitting: Pulmonary Disease

## 2012-06-04 ENCOUNTER — Encounter: Payer: Self-pay | Admitting: Pulmonary Disease

## 2012-06-04 VITALS — BP 110/82 | HR 73 | Temp 98.2°F | Ht 66.0 in | Wt 161.0 lb

## 2012-06-04 DIAGNOSIS — D869 Sarcoidosis, unspecified: Secondary | ICD-10-CM

## 2012-06-04 LAB — PULMONARY FUNCTION TEST

## 2012-06-04 MED ORDER — MOMETASONE FURO-FORMOTEROL FUM 100-5 MCG/ACT IN AERO: INHALATION_SPRAY | RESPIRATORY_TRACT | Status: DC

## 2012-06-04 NOTE — Progress Notes (Signed)
PFT done today. 

## 2012-06-04 NOTE — Addendum Note (Signed)
Addended by: Nita Sells on: 06/04/2012 11:26 AM   Modules accepted: Orders

## 2012-06-04 NOTE — Progress Notes (Signed)
  Subjective:    Patient ID: Stefanie Orozco, female    DOB: 06/22/1960, 52 y.o.   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  She has been very stable from a pulmonary standpoint over the last one year, and her cough is totally controlled with her dulera.  She is exercising aggressively with no significant change in her breathing.  She is satisfied with her exertional tolerance.  She's had no significant skin changes or eye issues.  She did have a chest x-ray today that is totally stable from the prior.  She also had pulmonary function studies today that overall appear very stable as well.   Review of Systems  Constitutional: Negative for fever and unexpected weight change.  HENT: Positive for ear pain ( ear itching), congestion, sneezing, postnasal drip and sinus pressure. Negative for nosebleeds, sore throat, rhinorrhea, trouble swallowing and dental problem.   Eyes: Positive for itching. Negative for redness.  Respiratory: Positive for cough ( dry cough at times). Negative for chest tightness, shortness of breath and wheezing.   Cardiovascular: Negative for palpitations and leg swelling.  Gastrointestinal: Negative for nausea and vomiting.  Genitourinary: Negative for dysuria.  Musculoskeletal: Negative for joint swelling.  Skin: Negative for rash.  Neurological: Positive for headaches.  Hematological: Does not bruise/bleed easily.  Psychiatric/Behavioral: Negative for dysphoric mood. The patient is not nervous/anxious.        Objective:   Physical Exam Well-developed female in no acute distress Nose without purulence or discharge noted Neck without lymphadenopathy or thyromegaly Chest totally clear to auscultation, no wheezing Cardiac exam with regular rate and rhythm Lower extremities without edema, no cyanosis Alert and oriented, moves all 4 extremities.       Assessment & Plan:

## 2012-06-04 NOTE — Assessment & Plan Note (Signed)
The patient is doing extremely well from a symptom standpoint, with total control of her airway symptoms on dulera.  Her chest x-ray is unchanged from prior, and her PFTs showed minimal change in total lung capacity and diffusion capacity from 2 years ago.  The patient is exercising regularly, and is totally satisfied with her exertional tolerance.

## 2012-06-04 NOTE — Patient Instructions (Addendum)
Stay on your inhaler for cough, and keep as active as possible. followup with me in one year, but call if issues.

## 2012-06-13 ENCOUNTER — Encounter: Payer: Self-pay | Admitting: Pulmonary Disease

## 2012-07-17 ENCOUNTER — Other Ambulatory Visit: Payer: Self-pay | Admitting: Pulmonary Disease

## 2012-07-17 ENCOUNTER — Other Ambulatory Visit: Payer: Self-pay | Admitting: Internal Medicine

## 2012-07-18 ENCOUNTER — Telehealth: Payer: Self-pay | Admitting: Pulmonary Disease

## 2012-07-18 MED ORDER — ALBUTEROL SULFATE HFA 108 (90 BASE) MCG/ACT IN AERS: INHALATION_SPRAY | RESPIRATORY_TRACT | Status: DC

## 2012-07-18 MED ORDER — MOMETASONE FURO-FORMOTEROL FUM 100-5 MCG/ACT IN AERO: INHALATION_SPRAY | RESPIRATORY_TRACT | Status: DC

## 2012-07-18 NOTE — Telephone Encounter (Signed)
Pt aware RX's have been sent. Nothing further was needed

## 2012-11-19 ENCOUNTER — Other Ambulatory Visit: Payer: Self-pay

## 2012-11-19 DIAGNOSIS — Z1231 Encounter for screening mammogram for malignant neoplasm of breast: Secondary | ICD-10-CM

## 2012-12-18 ENCOUNTER — Ambulatory Visit
Admission: RE | Admit: 2012-12-18 | Discharge: 2012-12-18 | Disposition: A | Discharge status: Home / Self Care | Payer: Federal, State, Local not specified - PPO | Source: Ambulatory Visit

## 2012-12-18 DIAGNOSIS — Z1231 Encounter for screening mammogram for malignant neoplasm of breast: Secondary | ICD-10-CM

## 2013-04-01 ENCOUNTER — Ambulatory Visit
Admission: RE | Admit: 2013-04-01 | Discharge: 2013-04-01 | Disposition: A | Discharge status: Home / Self Care | Payer: PRIVATE HEALTH INSURANCE | Source: Ambulatory Visit | Attending: Internal Medicine | Admitting: Internal Medicine

## 2013-04-01 ENCOUNTER — Other Ambulatory Visit: Payer: Self-pay | Admitting: Internal Medicine

## 2013-04-01 DIAGNOSIS — M2550 Pain in unspecified joint: Secondary | ICD-10-CM

## 2013-04-01 DIAGNOSIS — W19XXXA Unspecified fall, initial encounter: Secondary | ICD-10-CM

## 2013-06-05 ENCOUNTER — Ambulatory Visit (INDEPENDENT_AMBULATORY_CARE_PROVIDER_SITE_OTHER): Payer: Federal, State, Local not specified - PPO | Admitting: Pulmonary Disease

## 2013-06-05 ENCOUNTER — Encounter (INDEPENDENT_AMBULATORY_CARE_PROVIDER_SITE_OTHER): Payer: Self-pay

## 2013-06-05 ENCOUNTER — Encounter: Payer: Self-pay | Admitting: Pulmonary Disease

## 2013-06-05 VITALS — BP 120/78 | HR 81 | Temp 97.7°F | Ht 68.0 in | Wt 163.6 lb

## 2013-06-05 DIAGNOSIS — D869 Sarcoidosis, unspecified: Secondary | ICD-10-CM

## 2013-06-05 MED ORDER — ALBUTEROL SULFATE HFA 108 (90 BASE) MCG/ACT IN AERS: INHALATION_SPRAY | RESPIRATORY_TRACT | Status: DC

## 2013-06-05 MED ORDER — MOMETASONE FURO-FORMOTEROL FUM 100-5 MCG/ACT IN AERO: INHALATION_SPRAY | RESPIRATORY_TRACT | Status: DC

## 2013-06-05 NOTE — Patient Instructions (Signed)
No change in meds.  Will send in new scripts for a year.  Stay active, and call if having issues. followup with me in one year.

## 2013-06-05 NOTE — Assessment & Plan Note (Signed)
The patient is doing well from a sarcoid standpoint, and her cough is being controlled with her LABA/ICS. I've asked her to continue on her current medications, and to stay as active as possible.

## 2013-06-05 NOTE — Progress Notes (Signed)
   Subjective:    Patient ID: Stefanie Orozco, female    DOB: 01-22-1961, 53 y.o.   MRN: [id number]  HPI The patient comes in today for followup of her known sarcoidosis. She has done very well from a breathing standpoint, and has excellent exertional tolerance. She has had no issues with cough since staying on her inhaler regimen, and feels that she is maintaining her baseline.   Review of Systems  Constitutional: Negative for fever and unexpected weight change.  HENT: Negative for congestion, dental problem, ear pain, nosebleeds, postnasal drip, rhinorrhea, sinus pressure, sneezing, sore throat and trouble swallowing.   Eyes: Negative for redness and itching.  Respiratory: Negative for cough, chest tightness, shortness of breath and wheezing.   Cardiovascular: Positive for leg swelling. Negative for palpitations.  Gastrointestinal: Negative for nausea and vomiting.  Genitourinary: Negative for dysuria.  Musculoskeletal: Negative for joint swelling.  Skin: Negative for rash.  Neurological: Negative for headaches.  Hematological: Does not bruise/bleed easily.  Psychiatric/Behavioral: Negative for dysphoric mood. The patient is not nervous/anxious.        Objective:   Physical Exam Well-developed female in no acute distress Nose without purulence or discharge noted Neck without lymphadenopathy or thyromegaly Chest completely clear to auscultation, no wheezing Hands without cyanosis or obvious abnormality on the tips of the fingers Lower extremities with minimal edema, no cyanosis Alert and oriented, moves all 4 extremities.       Assessment & Plan:

## 2013-07-08 ENCOUNTER — Other Ambulatory Visit: Payer: Self-pay | Admitting: Physical Medicine and Rehabilitation

## 2013-07-08 DIAGNOSIS — M542 Cervicalgia: Secondary | ICD-10-CM

## 2013-07-13 ENCOUNTER — Other Ambulatory Visit: Payer: Federal, State, Local not specified - PPO

## 2013-07-20 ENCOUNTER — Ambulatory Visit
Admission: RE | Admit: 2013-07-20 | Discharge: 2013-07-20 | Disposition: A | Discharge status: Home / Self Care | Payer: Federal, State, Local not specified - PPO | Source: Ambulatory Visit | Attending: Physical Medicine and Rehabilitation | Admitting: Physical Medicine and Rehabilitation

## 2013-07-20 DIAGNOSIS — M542 Cervicalgia: Secondary | ICD-10-CM

## 2013-07-28 ENCOUNTER — Ambulatory Visit (INDEPENDENT_AMBULATORY_CARE_PROVIDER_SITE_OTHER): Payer: Self-pay

## 2013-07-28 ENCOUNTER — Ambulatory Visit (INDEPENDENT_AMBULATORY_CARE_PROVIDER_SITE_OTHER): Payer: Federal, State, Local not specified - PPO | Admitting: Neurology

## 2013-07-28 DIAGNOSIS — Z0289 Encounter for other administrative examinations: Secondary | ICD-10-CM

## 2013-07-28 DIAGNOSIS — R209 Unspecified disturbances of skin sensation: Secondary | ICD-10-CM

## 2013-07-28 DIAGNOSIS — M79601 Pain in right arm: Secondary | ICD-10-CM

## 2013-07-28 DIAGNOSIS — M79609 Pain in unspecified limb: Secondary | ICD-10-CM

## 2013-07-28 HISTORY — DX: Pain in right arm: M79.601

## 2013-07-28 NOTE — Procedures (Signed)
   NCS (NERVE CONDUCTION STUDY) WITH EMG (ELECTROMYOGRAPHY) REPORT   STUDY DATE: June 30th 2015 PATIENT NAME: Stefanie Orozco DOB: Aug 26, 1960 MRN: [id number]    TECHNOLOGIST: Laretta Alstrom ELECTROMYOGRAPHER: Marcial Pacas M.D.  CLINICAL INFORMATION:  53 years old right-handed female, fell on ice, braced with her right hand, has been complaining of right hand, wrist, right shoulder, right neck pain, subjective weakness On examination: Right upper extremity motor strength was felt to be normal, there was giveaway weakness because of the pain, deep tendon reflex was normal and symmetric  FINDINGS: NERVE CONDUCTION STUDY: Bilateral median, ulnar sensory and motor responses were normal  NEEDLE ELECTROMYOGRAPHY: Selected needle examination was performed at right upper extremity muscles, right cervical paraspinal muscles  Needle examination of right extensive distal commonness, triceps, biceps, deltoid, flexor carpi ulnaris, pronator teres, right first dorsal interossei was normal  There was no spontaneous activity at right cervical paraspinal muscles, right C5, 6, 7  IMPRESSION:  This is a normal study. There was no electrodiagnostic evidence of right upper extremity neuropathy, or right cervical radiculopathy.   INTERPRETING PHYSICIAN:   Marcial Pacas M.D. Ph.D. [facility] [address] [place], [place] [address] [phone]

## 2013-09-04 ENCOUNTER — Emergency Department (INDEPENDENT_AMBULATORY_CARE_PROVIDER_SITE_OTHER): Payer: Federal, State, Local not specified - PPO

## 2013-09-04 ENCOUNTER — Emergency Department
Admission: EM | Admit: 2013-09-04 | Discharge: 2013-09-04 | Disposition: A | Discharge status: Home / Self Care | Payer: Federal, State, Local not specified - PPO | Source: Home / Self Care | Attending: Family Medicine | Admitting: Family Medicine

## 2013-09-04 ENCOUNTER — Encounter: Payer: Self-pay | Admitting: Emergency Medicine

## 2013-09-04 DIAGNOSIS — IMO0002 Reserved for concepts with insufficient information to code with codable children: Secondary | ICD-10-CM

## 2013-09-04 DIAGNOSIS — D1739 Benign lipomatous neoplasm of skin and subcutaneous tissue of other sites: Secondary | ICD-10-CM

## 2013-09-04 DIAGNOSIS — R229 Localized swelling, mass and lump, unspecified: Secondary | ICD-10-CM

## 2013-09-04 DIAGNOSIS — D171 Benign lipomatous neoplasm of skin and subcutaneous tissue of trunk: Secondary | ICD-10-CM

## 2013-09-04 DIAGNOSIS — R079 Chest pain, unspecified: Secondary | ICD-10-CM

## 2013-09-04 HISTORY — DX: Hidradenitis suppurativa: L73.2

## 2013-09-04 HISTORY — DX: Sarcoidosis of lung: D86.0

## 2013-09-04 HISTORY — DX: Mononeuropathy, unspecified: G58.9

## 2013-09-04 HISTORY — DX: Injury of unspecified nerves of neck, initial encounter: S14.9XXA

## 2013-09-04 HISTORY — DX: Gastro-esophageal reflux disease without esophagitis: K21.9

## 2013-09-04 NOTE — Discharge Instructions (Signed)
Lipoma  A lipoma is a noncancerous (benign) tumor composed of fat cells. They are usually found under the skin (subcutaneous). A lipoma may occur in any tissue of the body that contains fat. Common areas for lipomas to appear include the back, shoulders, buttocks, and thighs. Lipomas are a very common soft tissue growth. They are soft and grow slowly. Most problems caused by a lipoma depend on where it is growing.  DIAGNOSIS   A lipoma can be diagnosed with a physical exam. These tumors rarely become cancerous, but radiographic studies can help determine this for certain. Studies used may include:  · Computerized X-ray scans (CT or CAT scan).  · Computerized magnetic scans (MRI).  TREATMENT   Small lipomas that are not causing problems may be watched. If a lipoma continues to enlarge or causes problems, removal is often the best treatment. Lipomas can also be removed to improve appearance. Surgery is done to remove the fatty cells and the surrounding capsule. Most often, this is done with medicine that numbs the area (local anesthetic). The removed tissue is examined under a microscope to make sure it is not cancerous. Keep all follow-up appointments with your caregiver.  SEEK MEDICAL CARE IF:   · The lipoma becomes larger or hard.  · The lipoma becomes painful, red, or increasingly swollen. These could be signs of infection or a more serious condition.  Document Released: 01/05/2002 Document Revised: 04/09/2011 Document Reviewed: 06/17/2009  ExitCare® Patient Information ©2015 [employer]. This information is not intended to replace advice given to you by your health care provider. Make sure you discuss any questions you have with your health care provider.

## 2013-09-04 NOTE — ED Notes (Signed)
Reports  noticed a mass on left lower rib area about one week ago; no known injury; no pain although tender to touch because it has been palpated numerous times.

## 2013-09-04 NOTE — ED Provider Notes (Signed)
CSN: [id number]     Arrival date & time 09/04/13  1419 History   First MD Initiated Contact with Patient 09/04/13 1613     Chief Complaint  Patient presents with  . Mass      HPI Comments: Patient discovered a subtle mass on her left lower anterior ribs about one week ago, and is not sure how long it has been there (her husband actually noticed it).  The area is not painful (but is somewhat sore because she has been palpating it so frequently).  There have been no changes in the overlying skin.  The history is provided by the patient.    Past Medical History  Diagnosis Date  . Sarcoid 1998    ACE> 400, compatible cxr  . Pulmonary sarcoidosis   . GERD (gastroesophageal reflux disease)   . Pinched nerve in neck   . Hidradenitis    Past Surgical History  Procedure Laterality Date  . Laparoscopic tubal ligation    . Ablation on endometriosis    . Laproscopic    . Breast surgery     Family History  Problem Relation Age of Onset  . Stroke Mother   . Hypertension Mother   . Hypertension Father    History  Substance Use Topics  . Smoking status: Former Smoker    Quit date: 01/29/1985  . Smokeless tobacco: Not on file  . Alcohol Use: No   OB History   Grav Para Term Preterm Abortions TAB SAB Ect Mult Living                 Review of Systems  Constitutional: Negative for fever, chills, fatigue and unexpected weight change.  HENT: Negative.   Eyes: Negative.   Respiratory: Negative for chest tightness.   Cardiovascular: Negative.   Gastrointestinal: Negative.   Genitourinary: Negative.   Musculoskeletal: Negative.   Skin: Negative for color change and rash.       Mass on left chest    Allergies  Codeine  Home Medications   Prior to Admission medications   Medication Sig Start Date End Date Taking? Authorizing Provider  albuterol (PROAIR HFA) 108 (90 BASE) MCG/ACT inhaler 1-2 puffs every 4-6 hours as needed 06/05/13   Kathee Delton, MD    fexofenadine-pseudoephedrine (ALLEGRA-D) 60-120 MG per tablet Take 1 tablet by mouth. Take one two times a day as needed      Historical Provider, MD  lansoprazole (PREVACID) 15 MG capsule Take 15 mg by mouth. Take one daily as needed    Historical Provider, MD  mometasone-formoterol [facility]) 100-5 MCG/ACT AERO 2 puffs twice a day 06/05/13   Kathee Delton, MD  omeprazole (PRILOSEC) 20 MG capsule Take by mouth Daily. 03/05/11   Historical Provider, MD   BP 131/82  Pulse 75  Temp(Src) 97.9 F (36.6 C) (Oral)  Resp 16  SpO2 99% Physical Exam  Nursing note and vitals reviewed. Constitutional: She is oriented to person, place, and time. She appears well-developed and well-nourished. No distress.  HENT:  Head: Normocephalic.  Nose: Nose normal.  Mouth/Throat: Oropharynx is clear and moist.  Eyes: Conjunctivae and EOM are normal. Pupils are equal, round, and reactive to light.  Neck: Neck supple.  Cardiovascular: Normal heart sounds.   Pulmonary/Chest: Breath sounds normal. She exhibits mass. She exhibits no tenderness, no swelling and no retraction.    Over the left anterior costal margin, as noted on diagram, is a slightly raised area measuring about 5cm by 8cm.  Palpation  reveals a soft, nontender, mass with indistinct edges, and no overlying rash, warmth, induration or fluctuance.    Abdominal: Soft. She exhibits no mass. There is no tenderness.  Musculoskeletal: She exhibits no edema.  Lymphadenopathy:    She has no cervical adenopathy.  Neurological: She is alert and oriented to person, place, and time.  Skin: Skin is warm and dry. No rash noted.    ED Course  Procedures  none     Imaging Review US Soft Tissue Head/neck  09/04/2013   CLINICAL DATA:  Painful, palpable soft tissue nodule in the left lower rib area.  EXAM: ULTRASOUND OF CHEST SOFT TISSUES  TECHNIQUE: Ultrasound examination of the chest wall soft tissues was performed in the area of clinical concern.  COMPARISON:   None.  FINDINGS: Sonographic interrogation of the region of the patient's discomfort demonstrates normal appearing subcutaneous fat, costal cartilage and ribs. No defined soft tissue mass, fluid collection or other acute abnormality.  IMPRESSION: No focal abnormality in the region of concern.   Electronically Signed   By: Jacqulynn Cadet M.D.   On: 09/04/2013 15:41     MDM   1. Mass   2. Lipoma of skin and subcutaneous tissue of trunk    Reassurance. Followup with PCP if significant changes occur    Kandra Nicolas, MD 09/08/13 [phone]

## 2013-11-26 ENCOUNTER — Other Ambulatory Visit: Payer: Self-pay

## 2013-11-26 DIAGNOSIS — Z1231 Encounter for screening mammogram for malignant neoplasm of breast: Secondary | ICD-10-CM

## 2013-12-21 ENCOUNTER — Ambulatory Visit: Payer: Federal, State, Local not specified - PPO

## 2014-02-02 ENCOUNTER — Other Ambulatory Visit: Payer: Self-pay | Admitting: Physician Assistant

## 2014-06-05 ENCOUNTER — Other Ambulatory Visit: Payer: Self-pay | Admitting: Pulmonary Disease

## 2014-06-07 ENCOUNTER — Ambulatory Visit: Payer: Federal, State, Local not specified - PPO | Admitting: Pulmonary Disease

## 2014-06-25 ENCOUNTER — Encounter (INDEPENDENT_AMBULATORY_CARE_PROVIDER_SITE_OTHER): Payer: Self-pay

## 2014-06-25 ENCOUNTER — Ambulatory Visit (INDEPENDENT_AMBULATORY_CARE_PROVIDER_SITE_OTHER): Payer: Federal, State, Local not specified - PPO | Admitting: Pulmonary Disease

## 2014-06-25 ENCOUNTER — Encounter: Payer: Self-pay | Admitting: Pulmonary Disease

## 2014-06-25 ENCOUNTER — Ambulatory Visit (INDEPENDENT_AMBULATORY_CARE_PROVIDER_SITE_OTHER)
Admission: RE | Admit: 2014-06-25 | Discharge: 2014-06-25 | Disposition: A | Discharge status: Home / Self Care | Payer: Federal, State, Local not specified - PPO | Source: Ambulatory Visit | Attending: Pulmonary Disease | Admitting: Pulmonary Disease

## 2014-06-25 VITALS — BP 124/66 | HR 71 | Temp 98.4°F | Ht 68.0 in | Wt 160.0 lb

## 2014-06-25 DIAGNOSIS — D869 Sarcoidosis, unspecified: Secondary | ICD-10-CM

## 2014-06-25 NOTE — Patient Instructions (Signed)
No change in medications. Will check chest xray today and call you with results. Will schedule for breathing studies at your convenience, and will call  followup with Dr. Lamonte Sakai in one year.

## 2014-06-25 NOTE — Progress Notes (Signed)
   Subjective:    Patient ID: Stefanie Orozco, female    DOB: 07/13/1960, 54 y.o.   MRN: [id number]  HPI The patient comes in today for follow-up of her known sarcoidosis. She has done well from a pulmonary standpoint, and enjoys a very active lifestyle. She is continuing on her Symbicort which helps her cough and shortness of breath. However, she has noticed a decline in her exertional tolerance over the last one year. It does not interfere with her lifestyle, but she notices it.   Review of Systems  Constitutional: Negative for fever and unexpected weight change.  HENT: Negative for congestion, dental problem, ear pain, nosebleeds, postnasal drip, rhinorrhea, sinus pressure, sneezing, sore throat and trouble swallowing.   Eyes: Negative for redness and itching.  Respiratory: Positive for shortness of breath and wheezing. Negative for cough and chest tightness.   Cardiovascular: Negative for palpitations and leg swelling.  Gastrointestinal: Negative for nausea and vomiting.  Genitourinary: Negative for dysuria.  Musculoskeletal: Negative for joint swelling.  Skin: Negative for rash.  Neurological: Negative for headaches.  Hematological: Does not bruise/bleed easily.  Psychiatric/Behavioral: Negative for dysphoric mood. The patient is not nervous/anxious.        Objective:   Physical Exam Well-developed female in no acute distress Nose without purulence or discharge noted Neck without lymphadenopathy or thyromegaly Chest clear to auscultation, no wheezes or crackles Cardiac exam with regular rate and rhythm Lower extremities without edema, no cyanosis Alert and oriented, moves all 4 extremities.        Assessment & Plan:

## 2014-06-25 NOTE — Assessment & Plan Note (Signed)
The patient overall feels that she is doing well from a sarcoid standpoint. Her cough and other pulmonary symptoms are fairly well-controlled with her bronchodilator regimen, but she has seen a slow decline in her exertional tolerance over the years and is due for follow-up PFTs and chest x-ray.  She continues to lead an active lifestyle, and is maintaining her weight. Will schedule for her follow-up testing, and call her with results. She is to follow-up formally in one year, or sooner if she is having worsening symptoms.

## 2014-06-30 ENCOUNTER — Telehealth: Payer: Self-pay | Admitting: Pulmonary Disease

## 2014-06-30 NOTE — Telephone Encounter (Signed)
Result Note     Let pt know that her cxr is stable.  --  I spoke with patient about results and she verbalized understanding and had no questions.

## 2014-08-12 ENCOUNTER — Encounter (INDEPENDENT_AMBULATORY_CARE_PROVIDER_SITE_OTHER): Payer: Self-pay

## 2014-08-12 ENCOUNTER — Ambulatory Visit (INDEPENDENT_AMBULATORY_CARE_PROVIDER_SITE_OTHER): Payer: Federal, State, Local not specified - PPO | Admitting: Emergency Medicine

## 2014-08-12 DIAGNOSIS — D869 Sarcoidosis, unspecified: Secondary | ICD-10-CM

## 2014-08-12 LAB — PULMONARY FUNCTION TEST
DL/VA % pred: 90 %
DL/VA: 4.55 ml/min/mmHg/L
DLCO unc % pred: 67 %
DLCO unc: 18.29 ml/min/mmHg
FEF 25-75 Post: 2.18 L/sec
FEF 25-75 Pre: 2.45 L/sec
FEF2575-%Change-Post: -11 %
FEF2575-%Pred-Post: 89 %
FEF2575-%Pred-Pre: 100 %
FEV1-%Change-Post: 0 %
FEV1-%Pred-Post: 104 %
FEV1-%Pred-Pre: 105 %
FEV1-Post: 2.51 L
FEV1-Pre: 2.53 L
FEV1FVC-%Change-Post: 2 %
FEV1FVC-%Pred-Pre: 98 %
FEV6-%Change-Post: -2 %
FEV6-%Pred-Post: 103 %
FEV6-%Pred-Pre: 106 %
FEV6-Post: 3.06 L
FEV6-Pre: 3.13 L
FEV6FVC-%Change-Post: 0 %
FEV6FVC-%Pred-Post: 103 %
FEV6FVC-%Pred-Pre: 102 %
FVC-%Change-Post: -3 %
FVC-%Pred-Post: 101 %
FVC-%Pred-Pre: 104 %
FVC-Post: 3.07 L
FVC-Pre: 3.16 L
Post FEV1/FVC ratio: 82 %
Post FEV6/FVC ratio: 100 %
Pre FEV1/FVC ratio: 80 %
Pre FEV6/FVC Ratio: 99 %
RV % pred: 59 %
RV: 1.17 L
TLC % pred: 86 %
TLC: 4.64 L

## 2014-08-12 NOTE — Progress Notes (Signed)
PFT done today. 

## 2014-09-03 ENCOUNTER — Telehealth: Payer: Self-pay | Admitting: Emergency Medicine

## 2014-09-03 NOTE — Telephone Encounter (Signed)
Spoke with pt. Nothing further needed  °

## 2014-09-03 NOTE — Telephone Encounter (Signed)
Spoke with TW. She will call the pt.

## 2014-10-07 ENCOUNTER — Telehealth: Payer: Self-pay | Admitting: Emergency Medicine

## 2014-10-07 MED ORDER — ALBUTEROL SULFATE HFA 108 (90 BASE) MCG/ACT IN AERS
INHALATION_SPRAY | RESPIRATORY_TRACT | Status: DC
Start: 2014-10-07 — End: 2015-06-14

## 2014-10-07 NOTE — Telephone Encounter (Signed)
Called pt. She needed refill on proair. I have sent this in. Nothing further needed

## 2014-10-27 DIAGNOSIS — M25549 Pain in joints of unspecified hand: Secondary | ICD-10-CM | POA: Insufficient documentation

## 2014-12-20 ENCOUNTER — Other Ambulatory Visit: Payer: Self-pay | Admitting: *Deleted

## 2015-05-19 ENCOUNTER — Other Ambulatory Visit: Payer: Self-pay | Admitting: Physical Medicine and Rehabilitation

## 2015-05-19 DIAGNOSIS — M5013 Cervical disc disorder with radiculopathy, cervicothoracic region: Secondary | ICD-10-CM

## 2015-05-19 DIAGNOSIS — M5023 Other cervical disc displacement, cervicothoracic region: Secondary | ICD-10-CM

## 2015-05-19 DIAGNOSIS — M542 Cervicalgia: Secondary | ICD-10-CM

## 2015-05-19 DIAGNOSIS — M4723 Other spondylosis with radiculopathy, cervicothoracic region: Secondary | ICD-10-CM

## 2015-05-19 DIAGNOSIS — G894 Chronic pain syndrome: Secondary | ICD-10-CM

## 2015-05-24 ENCOUNTER — Other Ambulatory Visit: Payer: Self-pay | Admitting: Physical Medicine and Rehabilitation

## 2015-05-24 DIAGNOSIS — M5002 Cervical disc disorder with myelopathy, mid-cervical region, unspecified level: Secondary | ICD-10-CM

## 2015-05-24 DIAGNOSIS — M5023 Other cervical disc displacement, cervicothoracic region: Secondary | ICD-10-CM

## 2015-05-27 ENCOUNTER — Ambulatory Visit
Admission: RE | Admit: 2015-05-27 | Discharge: 2015-05-27 | Disposition: A | Discharge status: Home / Self Care | Payer: Worker's Compensation | Source: Ambulatory Visit | Attending: Physical Medicine and Rehabilitation | Admitting: Physical Medicine and Rehabilitation

## 2015-05-27 DIAGNOSIS — M5023 Other cervical disc displacement, cervicothoracic region: Secondary | ICD-10-CM

## 2015-05-27 DIAGNOSIS — M5002 Cervical disc disorder with myelopathy, mid-cervical region, unspecified level: Secondary | ICD-10-CM

## 2015-06-14 ENCOUNTER — Encounter: Payer: Self-pay | Admitting: Emergency Medicine

## 2015-06-14 ENCOUNTER — Ambulatory Visit (INDEPENDENT_AMBULATORY_CARE_PROVIDER_SITE_OTHER): Payer: Federal, State, Local not specified - PPO | Admitting: Emergency Medicine

## 2015-06-14 VITALS — BP 114/82 | HR 78 | Ht 68.0 in | Wt 168.0 lb

## 2015-06-14 DIAGNOSIS — D869 Sarcoidosis, unspecified: Secondary | ICD-10-CM | POA: Diagnosis not present

## 2015-06-14 DIAGNOSIS — Z23 Encounter for immunization: Secondary | ICD-10-CM

## 2015-06-14 MED ORDER — ALBUTEROL SULFATE HFA 108 (90 BASE) MCG/ACT IN AERS: INHALATION_SPRAY | RESPIRATORY_TRACT | Status: DC

## 2015-06-14 MED ORDER — MOMETASONE FURO-FORMOTEROL FUM 100-5 MCG/ACT IN AERO: 2.0000 | INHALATION_SPRAY | Freq: Two times a day (BID) | RESPIRATORY_TRACT | Status: DC

## 2015-06-14 NOTE — Patient Instructions (Addendum)
Please continue your Ruthe Mannan twice a day Keep albuterol available to use 2 puffs as needed  You will need repeat PFT's and chest imaging.  Remember to get your eye exam annually Flu shot every year.  We will give the Pneumovax-23 today. You will need the Prevnar-13 in a year (May 2018) We will make sure that your records are forwarded to your new Pulmonologist in [place].

## 2015-06-14 NOTE — Addendum Note (Signed)
Addended by: Desmond Dike C on: 06/14/2015 01:39 PM   Modules accepted: Orders

## 2015-06-14 NOTE — Assessment & Plan Note (Signed)
Please continue your Ruthe Mannan twice a day Keep albuterol available to use 2 puffs as needed  You will need repeat PFT's and chest imaging.  Remember to get your eye exam annually Flu shot every year.  We will give the Pneumovax-23 today. You will need the Prevnar-13 in a year (May 2018) We will make sure that your records are forwarded to your new Pulmonologist in [place].

## 2015-06-14 NOTE — Progress Notes (Signed)
Subjective:    Patient ID: Stefanie Orozco, female    DOB: Jun 04, 1960, 55 y.o.   MRN: [id number]  HPI 55 year old woman has been followed in the past by Dr Gwenette Greet for probable sarcoidosis based on her chest x-ray and elevated ace level. She also his possible mild obstructive disease based on current flow volume loop on pulmonary function testing. The last was done July 2016. She has been on [facility] since 2014.  She has albuterol prn - uses it most mornings and sometimes w exertion. Her last CXR was 05/2014. She hasn't had a flare in several years. No skin manifestations.    Review of Systems As per HPI  Past Medical History  Diagnosis Date  . Sarcoid ([place]) 1998    ACE> 400, compatible cxr  . Pulmonary sarcoidosis ([place])   . GERD (gastroesophageal reflux disease)   . Pinched nerve in neck   . Hidradenitis      Family History  Problem Relation Age of Onset  . Stroke Mother   . Hypertension Mother   . Hypertension Father      Social History   Social History  . Marital Status: Married    Spouse Name: N/A  . Number of Children: N/A  . Years of Education: N/A   Occupational History  . Not on file.   Social History Main Topics  . Smoking status: Former Smoker    Quit date: 01/29/1985  . Smokeless tobacco: Not on file  . Alcohol Use: No  . Drug Use: No  . Sexual Activity: Not on file   Other Topics Concern  . Not on file   Social History Narrative     Allergies  Allergen Reactions  . Codeine      Outpatient Prescriptions Prior to Visit  Medication Sig Dispense Refill  . fexofenadine-pseudoephedrine (ALLEGRA-D) 60-120 MG per tablet Take 1 tablet by mouth. Take one two times a day as needed      . Multiple Vitamin (MULTIVITAMIN WITH MINERALS) TABS tablet Take 1 tablet by mouth daily.    . NON FORMULARY Lidiapincum- OTC herbal supplement for hot flashes 1 tab bid    . omeprazole (PRILOSEC) 20 MG capsule Take 40 mg by mouth Daily.     Marland Kitchen albuterol (PROAIR HFA) 108  (90 BASE) MCG/ACT inhaler 1-2 puffs every 4-6 hours as needed 1 Inhaler 6  . DULERA 100-5 MCG/ACT AERO INHALE TWO PUFFS BY MOUTH TWICE DAILY 13 Inhaler 5  . baclofen (LIORESAL) 10 MG tablet Take 10 mg by mouth 2 (two) times daily.    . Doxepin HCl 6 MG TABS Take 6 mg by mouth daily.     No facility-administered medications prior to visit.          Objective:   Physical Exam Filed Vitals:   06/14/15 1249  BP: 114/82  Pulse: 78  Height: 5\' 8"  (1.727 m)  Weight: 168 lb (76.204 kg)  SpO2: 97%   Gen: Pleasant, well-nourished, in no distress,  normal affect  ENT: No lesions,  mouth clear,  oropharynx clear, no postnasal drip  Neck: No JVD, no TMG, no carotid bruits  Lungs: No use of accessory muscles, no dullness to percussion, clear without rales or rhonchi  Cardiovascular: RRR, heart sounds normal, no murmur or gallops, no peripheral edema  Musculoskeletal: No deformities, no cyanosis or clubbing  Neuro: alert, non focal  Skin: Warm, no lesions or rashes      Assessment & Plan:  SARCOIDOSIS, PULMONARY Please continue your  Dulera twice a day Keep albuterol available to use 2 puffs as needed  You will need repeat PFT's and chest imaging.  Remember to get your eye exam annually Flu shot every year.  We will give the Pneumovax-23 today. You will need the Prevnar-13 in a year (May 2018) We will make sure that your records are forwarded to your new Pulmonologist in [place].    Baltazar Apo, MD, PhD 06/14/2015, 1:22 PM [facility] [phone] or if no answer [phone]

## 2016-01-31 ENCOUNTER — Telehealth: Payer: Self-pay | Admitting: Emergency Medicine

## 2016-01-31 NOTE — Telephone Encounter (Signed)
email was sent in for RB---pt will need documentation that she has sarcoid.  Will forward this message to [place].  Pt was last seen on 06/14/15.  Please advise if she will need another ov for this.   thanks

## 2016-01-31 NOTE — Telephone Encounter (Signed)
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 What is required is documentation from my treating physician confirming its existence. I need Dr. Lamonte Sakai to complete this document for me. If guidance is required don't hesitate to reach out to me for what elements are required within this document. [place] [phone] cellphone

## 2016-02-02 NOTE — Telephone Encounter (Signed)
RB please advise. Thanks.  

## 2016-02-07 NOTE — Telephone Encounter (Signed)
Pt states a letter documenting her dx's will work. Pt states she is currently  in [place] for work, and ask that we mail the letter to [employer] [id number] [place], [id number].  RB please advise. Thanks.

## 2016-02-07 NOTE — Telephone Encounter (Signed)
I believe we can fill out the paperwork she needs based on current notes. Please ask her what we need to fill out

## 2016-02-07 NOTE — Telephone Encounter (Signed)
lmtcb X1 for pt to request clarification- is a form needing to be filled out, or just a letter documenting pt's diagnosis.

## 2016-02-08 ENCOUNTER — Encounter: Payer: Self-pay | Admitting: Emergency Medicine

## 2016-02-08 NOTE — Progress Notes (Signed)
Letter written, needs to be printed and faxed to requested # Thanks

## 2016-02-08 NOTE — Telephone Encounter (Signed)
Letter has been printed and signed. It has been placed in the mail to the address pt requested. Pt is aware. Nothing further was needed.

## 2016-02-08 NOTE — Telephone Encounter (Signed)
I wrote a letter, needs to be printed. Thanks.

## 2016-05-30 ENCOUNTER — Telehealth: Payer: Self-pay | Admitting: Emergency Medicine

## 2016-05-30 NOTE — Telephone Encounter (Signed)
Pt is seeing RB on 6/29- per 06/14/2015 AVS pt was to follow up with PFT and cxr.  It appears that pt was not planning on following up with our clinic so no order for pft was ordered.  RB ok to order PFT to be performed prior to OV?  Thanks!

## 2016-05-31 NOTE — Telephone Encounter (Signed)
Called and spoke to pt. Pt states she is only able to come in on 6.29.2018 for her appt and her PFT. Will need to wait to schedule the PFT until early June where the 2nd PFT schedule is opened.   Will close message and forward to myself and Joellen Jersey to follow.

## 2016-05-31 NOTE — Telephone Encounter (Signed)
Yes thanks - full PFT

## 2016-06-13 NOTE — Telephone Encounter (Signed)
LMTCB for pt.   PFT has been scheduled for 07/27/16 at 2pm, then OV with RB is on 07/27/16 at 3pm.

## 2016-07-26 ENCOUNTER — Other Ambulatory Visit: Payer: Self-pay | Admitting: Emergency Medicine

## 2016-07-26 DIAGNOSIS — D869 Sarcoidosis, unspecified: Secondary | ICD-10-CM

## 2016-07-26 NOTE — Addendum Note (Signed)
Addended by: Tyson Dense on: 07/26/2016 05:18 PM   Modules accepted: Orders

## 2016-07-27 ENCOUNTER — Ambulatory Visit (INDEPENDENT_AMBULATORY_CARE_PROVIDER_SITE_OTHER)
Admission: RE | Admit: 2016-07-27 | Discharge: 2016-07-27 | Disposition: A | Discharge status: Home / Self Care | Payer: Federal, State, Local not specified - PPO | Source: Ambulatory Visit | Attending: Emergency Medicine | Admitting: Emergency Medicine

## 2016-07-27 ENCOUNTER — Ambulatory Visit (INDEPENDENT_AMBULATORY_CARE_PROVIDER_SITE_OTHER): Payer: Federal, State, Local not specified - PPO | Admitting: Emergency Medicine

## 2016-07-27 ENCOUNTER — Encounter: Payer: Self-pay | Admitting: Emergency Medicine

## 2016-07-27 DIAGNOSIS — D869 Sarcoidosis, unspecified: Secondary | ICD-10-CM | POA: Diagnosis not present

## 2016-07-27 LAB — PULMONARY FUNCTION TEST
DL/VA % pred: 83 %
DL/VA: 4.27 ml/min/mmHg/L
DLCO COR % PRED: 59 %
DLCO cor: 16.39 ml/min/mmHg
DLCO unc % pred: 58 %
DLCO unc: 16.19 ml/min/mmHg
FEF 25-75 POST: 2.57 L/s
FEF 25-75 Pre: 2.56 L/sec
FEF2575-%Change-Post: 0 %
FEF2575-%Pred-Post: 107 %
FEF2575-%Pred-Pre: 106 %
FEV1-%CHANGE-POST: 0 %
FEV1-%Pred-Post: 101 %
FEV1-%Pred-Pre: 100 %
FEV1-POST: 2.44 L
FEV1-Pre: 2.43 L
FEV1FVC-%CHANGE-POST: 0 %
FEV1FVC-%Pred-Pre: 102 %
FEV6-%Change-Post: 0 %
FEV6-%PRED-PRE: 100 %
FEV6-%Pred-Post: 100 %
FEV6-PRE: 2.97 L
FEV6-Post: 2.99 L
FEV6FVC-%PRED-PRE: 103 %
FEV6FVC-%Pred-Post: 103 %
FVC-%CHANGE-POST: 0 %
FVC-%PRED-POST: 97 %
FVC-%PRED-PRE: 97 %
FVC-POST: 2.99 L
FVC-PRE: 2.97 L
POST FEV6/FVC RATIO: 100 %
Post FEV1/FVC ratio: 82 %
Pre FEV1/FVC ratio: 82 %
Pre FEV6/FVC Ratio: 100 %
RV % pred: 52 %
RV: 1.06 L
TLC % PRED: 75 %
TLC: 4.09 L

## 2016-07-27 MED ORDER — ALBUTEROL SULFATE HFA 108 (90 BASE) MCG/ACT IN AERS: INHALATION_SPRAY | RESPIRATORY_TRACT | 11 refills | Status: DC

## 2016-07-27 NOTE — Progress Notes (Signed)
PFT done today. 

## 2016-07-27 NOTE — Assessment & Plan Note (Signed)
Stable at this time. No rash. No eye manifestations or neuro sx.   Please continue Dulera as you are taking it Continue albuterol 2 puffs as needed Chest x-ray today Your breathing tests are stable compared with July 2016 Follow with Dr Lamonte Sakai in 12 months or sooner if you have any problems

## 2016-07-27 NOTE — Progress Notes (Signed)
Subjective:    Patient ID: Stefanie Orozco, female    DOB: 1960/11/24, 56 y.o.   MRN: [id number]  HPI 56 year old woman has been followed in the past by Dr Stefanie Orozco for probable sarcoidosis based on her chest x-ray and elevated ace level. She also his possible mild obstructive disease based on current flow volume loop on pulmonary function testing. The last was done July 2016. She has been on [facility] since 2014.  She has albuterol prn - uses it most mornings and sometimes w exertion. Her last CXR was 05/2014. She hasn't had a flare in several years. No skin manifestations.   ROV 07/27/16 -- Patient has a history of probable sarcoidosis based on radiology and an elevated Ace level. She underwent pulmonary function testing today that I have reviewed. This shows evidence primarily for restriction both on spirometry and on lung volumes. There may be some subtle evidence for coexisting obstruction based on the curve of her flow volume loop. Diffusion capacity is decreased but corrects to the normal range when adjusted for alveolar volume. No significant change from her prior PFTs in 2016. She is on Dulera 1 puff bid, uses ProAir about once a day, first thing in the am. No prednisone since last time. She has been treated for sinusitis since last time, usually during the pollen season. Exertional tolerance is good. No cough, no wheeze. No rash. She sees opthamologist annually.    Review of Systems As per HPI  Past Medical History:  Diagnosis Date  . GERD (gastroesophageal reflux disease)   . Hidradenitis   . Pinched nerve in neck   . Pulmonary sarcoidosis ([place])   . Sarcoid 1998   ACE> 400, compatible cxr     Family History  Problem Relation Age of Onset  . Stroke Mother   . Hypertension Mother   . Hypertension Father      Social History   Social History  . Marital status: Married    Spouse name: N/A  . Number of children: N/A  . Years of education: N/A   Occupational History  . Not on  file.   Social History Main Topics  . Smoking status: Former Smoker    Quit date: 01/29/1985  . Smokeless tobacco: Never Used  . Alcohol use No  . Drug use: No  . Sexual activity: Not on file   Other Topics Concern  . Not on file   Social History Narrative  . No narrative on file     Allergies  Allergen Reactions  . Codeine      Outpatient Medications Prior to Visit  Medication Sig Dispense Refill  . albuterol (PROAIR HFA) 108 (90 Base) MCG/ACT inhaler 1-2 puffs every 4-6 hours as needed 1 Inhaler 11  . Chlorzoxazone (LORZONE) 750 MG TABS Take 1 tablet by mouth daily.    . diclofenac (FLECTOR) 1.3 % PTCH Place 1 patch onto the skin daily.    . fexofenadine-pseudoephedrine (ALLEGRA-D) 60-120 MG per tablet Take 1 tablet by mouth. Take one two times a day as needed      . mometasone-formoterol (DULERA) 100-5 MCG/ACT AERO Inhale 2 puffs into the lungs 2 (two) times daily. 1 Inhaler 11  . Multiple Vitamin (MULTIVITAMIN WITH MINERALS) TABS tablet Take 1 tablet by mouth daily.    . NON FORMULARY Lidiapincum- OTC herbal supplement for hot flashes 1 tab bid    . omeprazole (PRILOSEC) 20 MG capsule Take 40 mg by mouth Daily.     . Doxepin HCl (  SILENOR) 6 MG TABS Take 1 tablet by mouth daily.     No facility-administered medications prior to visit.           Objective:   Physical Exam Vitals:   07/27/16 1501 07/27/16 1510  BP:  118/78  Pulse:  75  SpO2:  99%  Weight: 156 lb (70.8 kg)   Height: 5' 6.5" (1.689 m)    Gen: Pleasant, well-nourished, in no distress,  normal affect  ENT: No lesions,  mouth clear,  oropharynx clear, no postnasal drip  Neck: No JVD, no TMG, no carotid bruits  Lungs: No use of accessory muscles, no dullness to percussion, clear without rales or rhonchi  Cardiovascular: RRR, heart sounds normal, no murmur or gallops, no peripheral edema  Musculoskeletal: No deformities, no cyanosis or clubbing  Neuro: alert, non focal  Skin: Warm, no lesions  or rashes      Assessment & Plan:  SARCOIDOSIS, PULMONARY Stable at this time. No rash. No eye manifestations or neuro sx.   Please continue Dulera as you are taking it Continue albuterol 2 puffs as needed Chest x-ray today Your breathing tests are stable compared with July 2016 Follow with Dr Stefanie Orozco in 12 months or sooner if you have any problems  Baltazar Apo, MD, PhD 07/27/2016, 3:22 PM  Pulmonary and Critical Care [phone] or if no answer [phone]

## 2016-07-27 NOTE — Patient Instructions (Signed)
Please continue Dulera as you are taking it Continue albuterol 2 puffs as needed Chest x-ray today Your breathing tests are stable compared with July 2016 Follow with Dr Lamonte Sakai in 12 months or sooner if you have any problems

## 2016-07-31 ENCOUNTER — Other Ambulatory Visit: Payer: Self-pay | Admitting: Emergency Medicine

## 2016-10-04 ENCOUNTER — Other Ambulatory Visit: Payer: Self-pay | Admitting: Physician Assistant

## 2017-06-05 ENCOUNTER — Telehealth: Payer: Self-pay | Admitting: Emergency Medicine

## 2017-06-05 NOTE — Telephone Encounter (Signed)
Attempted to call the pt. I did not receive an answer. I have left a message for pt to return our call.  

## 2017-06-05 NOTE — Telephone Encounter (Signed)
Spoke with pt. Advised her that there is no mention in her last OV note stating that she would need a repeat PFT. She verbalized understanding. Nothing further was needed.

## 2017-06-05 NOTE — Telephone Encounter (Signed)
Patient returned call, [phone].

## 2017-07-18 ENCOUNTER — Ambulatory Visit
Admission: RE | Admit: 2017-07-18 | Discharge: 2017-07-18 | Disposition: A | Discharge status: Home / Self Care | Payer: Federal, State, Local not specified - PPO | Source: Ambulatory Visit | Attending: Obstetrics and Gynecology | Admitting: Obstetrics and Gynecology

## 2017-07-18 ENCOUNTER — Other Ambulatory Visit: Payer: Self-pay | Admitting: Obstetrics and Gynecology

## 2017-07-18 DIAGNOSIS — R1030 Lower abdominal pain, unspecified: Secondary | ICD-10-CM

## 2017-07-18 MED ORDER — IOHEXOL 300 MG/ML  SOLN
100.0000 mL | Freq: Once | INTRAMUSCULAR | Status: AC | PRN
  Administered 2017-07-18: 100 mL via INTRAVENOUS

## 2017-07-22 ENCOUNTER — Ambulatory Visit: Payer: Federal, State, Local not specified - PPO | Admitting: Emergency Medicine

## 2017-07-22 ENCOUNTER — Ambulatory Visit (INDEPENDENT_AMBULATORY_CARE_PROVIDER_SITE_OTHER)
Admission: RE | Admit: 2017-07-22 | Discharge: 2017-07-22 | Disposition: A | Discharge status: Home / Self Care | Payer: Federal, State, Local not specified - PPO | Source: Ambulatory Visit | Attending: Emergency Medicine | Admitting: Emergency Medicine

## 2017-07-22 ENCOUNTER — Encounter: Payer: Self-pay | Admitting: Emergency Medicine

## 2017-07-22 VITALS — BP 112/60 | HR 80 | Ht 67.0 in | Wt 151.0 lb

## 2017-07-22 DIAGNOSIS — D869 Sarcoidosis, unspecified: Secondary | ICD-10-CM

## 2017-07-22 MED ORDER — MOMETASONE FURO-FORMOTEROL FUM 100-5 MCG/ACT IN AERO: 2.0000 | INHALATION_SPRAY | Freq: Two times a day (BID) | RESPIRATORY_TRACT | 11 refills | Status: DC

## 2017-07-22 MED ORDER — ALBUTEROL SULFATE HFA 108 (90 BASE) MCG/ACT IN AERS: INHALATION_SPRAY | RESPIRATORY_TRACT | 11 refills | Status: DC

## 2017-07-22 NOTE — Assessment & Plan Note (Signed)
Chest x-ray today We will refill your [facility].  Please continue 2 puffs twice a day.  Remember to rinse and gargle after using. Keep albuterol available to use 2 puffs if needed for chest tightness, wheezing, shortness of breath. If you have any changes in your breathing, cough, mucus production then we will plan to repeat your breathing tests and your CT scan of the chest.  Please call us if you have any such symptoms. Follow with Dr Lamonte Sakai in 12 months or sooner if you have any problems

## 2017-07-22 NOTE — Progress Notes (Signed)
Subjective:    Patient ID: Stefanie Orozco, female    DOB: Jun 04, 1960, 57 y.o.   MRN: [id number]  HPI 57 year old woman has been followed in the past by Dr Gwenette Greet for probable sarcoidosis based on her chest x-ray and elevated ace level. She also his possible mild obstructive disease based on current flow volume loop on pulmonary function testing. The last was done July 2016. She has been on [facility] since 2014.  She has albuterol prn - uses it most mornings and sometimes w exertion. Her last CXR was 05/2014. She hasn't had a flare in several years. No skin manifestations.   ROV 07/27/16 -- Patient has a history of probable sarcoidosis based on radiology and an elevated Ace level. She underwent pulmonary function testing today that I have reviewed. This shows evidence primarily for restriction both on spirometry and on lung volumes. There may be some subtle evidence for coexisting obstruction based on the curve of her flow volume loop. Diffusion capacity is decreased but corrects to the normal range when adjusted for alveolar volume. No significant change from her prior PFTs in 2016. She is on Dulera 1 puff bid, uses ProAir about once a day, first thing in the am. No prednisone since last time. She has been treated for sinusitis since last time, usually during the pollen season. Exertional tolerance is good. No cough, no wheeze. No rash. She sees opthamologist annually.   ROV  07/22/17 --this is an annual follow-up visit for patient with a history of presumed sarcoidosis based on elevated ACE level and her chest x-ray findings.  She has restriction on pulmonary function testing with some possible subtle evidence for coexisting obstruction based on curve of her flow volume loop. She has been evaluated for some dull abd pain, underwent a CT abdomen recently. She has been well, no changes in her breathing, no cough or sputum. She remains on Dulera. She uses albuterol more in the pollen season. She goes back and  forth between here and [place].     Review of Systems As per HPI  Past Medical History:  Diagnosis Date  . GERD (gastroesophageal reflux disease)   . Hidradenitis   . Pinched nerve in neck   . Pulmonary sarcoidosis ([place])   . Sarcoid 1998   ACE> 400, compatible cxr     Family History  Problem Relation Age of Onset  . Stroke Mother   . Hypertension Mother   . Hypertension Father      Social History   Socioeconomic History  . Marital status: Married    Spouse name: Not on file  . Number of children: Not on file  . Years of education: Not on file  . Highest education level: Not on file  Occupational History  . Not on file  Social Needs  . Financial resource strain: Not on file  . Food insecurity:    Worry: Not on file    Inability: Not on file  . Transportation needs:    Medical: Not on file    Non-medical: Not on file  Tobacco Use  . Smoking status: Former Smoker    Last attempt to quit: 01/29/1985    Years since quitting: 32.4  . Smokeless tobacco: Never Used  Substance and Sexual Activity  . Alcohol use: No  . Drug use: No  . Sexual activity: Not on file  Lifestyle  . Physical activity:    Days per week: Not on file    Minutes per session: Not on  file  . Stress: Not on file  Relationships  . Social connections:    Talks on phone: Not on file    Gets together: Not on file    Attends religious service: Not on file    Active member of club or organization: Not on file    Attends meetings of clubs or organizations: Not on file    Relationship status: Not on file  . Intimate partner violence:    Fear of current or ex partner: Not on file    Emotionally abused: Not on file    Physically abused: Not on file    Forced sexual activity: Not on file  Other Topics Concern  . Not on file  Social History Narrative  . Not on file     Allergies  Allergen Reactions  . Codeine      Outpatient Medications Prior to Visit  Medication Sig Dispense Refill  .  Chlorzoxazone (LORZONE) 750 MG TABS Take 1 tablet by mouth daily.    . diclofenac (FLECTOR) 1.3 % PTCH Place 1 patch onto the skin daily.    . Doxepin HCl (SILENOR) 6 MG TABS Take 1 tablet by mouth daily.    . fexofenadine-pseudoephedrine (ALLEGRA-D) 60-120 MG per tablet Take 1 tablet by mouth. Take one two times a day as needed      . Multiple Vitamin (MULTIVITAMIN WITH MINERALS) TABS tablet Take 1 tablet by mouth daily.    . NON FORMULARY Lidiapincum- OTC herbal supplement for hot flashes 1 tab bid    . omeprazole (PRILOSEC) 40 MG capsule Take 40 mg by mouth daily.    Marland Kitchen albuterol (PROAIR HFA) 108 (90 Base) MCG/ACT inhaler 1-2 puffs every 4-6 hours as needed 1 Inhaler 11  . DULERA 100-5 MCG/ACT AERO INHALE 2 PUFFS INTO THE LUNGS 2 (TWO) TIMES DAILY. 1 Inhaler 5  . omeprazole (PRILOSEC) 20 MG capsule Take 40 mg by mouth Daily.      No facility-administered medications prior to visit.           Objective:   Physical Exam Vitals:   07/22/17 1629  BP: 112/60  Pulse: 80  SpO2: 100%  Weight: 151 lb (68.5 kg)  Height: 5\' 7"  (1.702 m)   Gen: Pleasant, well-nourished, in no distress,  normal affect  ENT: No lesions,  mouth clear,  oropharynx clear, no postnasal drip  Neck: No JVD, no stridor  Lungs: No use of accessory muscles, clear bilaterally  Cardiovascular: RRR, heart sounds normal, no murmur or gallops, no peripheral edema  Musculoskeletal: No deformities, no cyanosis or clubbing  Neuro: alert, non focal  Skin: Warm, no lesions or rashes      Assessment & Plan:  SARCOIDOSIS, PULMONARY Chest x-ray today We will refill your Dulera.  Please continue 2 puffs twice a day.  Remember to rinse and gargle after using. Keep albuterol available to use 2 puffs if needed for chest tightness, wheezing, shortness of breath. If you have any changes in your breathing, cough, mucus production then we will plan to repeat your breathing tests and your CT scan of the chest.  Please call  us if you have any such symptoms. Follow with Dr Lamonte Sakai in 12 months or sooner if you have any problems  Baltazar Apo, MD, PhD 07/22/2017, 5:05 PM [place] [facility] [phone] or if no answer [phone]

## 2017-07-22 NOTE — Patient Instructions (Signed)
Chest x-ray today We will refill your [facility].  Please continue 2 puffs twice a day.  Remember to rinse and gargle after using. Keep albuterol available to use 2 puffs if needed for chest tightness, wheezing, shortness of breath. If you have any changes in your breathing, cough, mucus production then we will plan to repeat your breathing tests and your CT scan of the chest.  Please call us if you have any such symptoms. Follow with Dr Lamonte Sakai in 12 months or sooner if you have any problems

## 2018-05-31 IMAGING — DX DG CHEST 2V
2 series · 2 of 2 positions shown · non-contrast
Comparison: 06/25/2014

CLINICAL DATA: Pulmonary sarcoidosis

EXAM:
CHEST  2 VIEW

[chest pa]
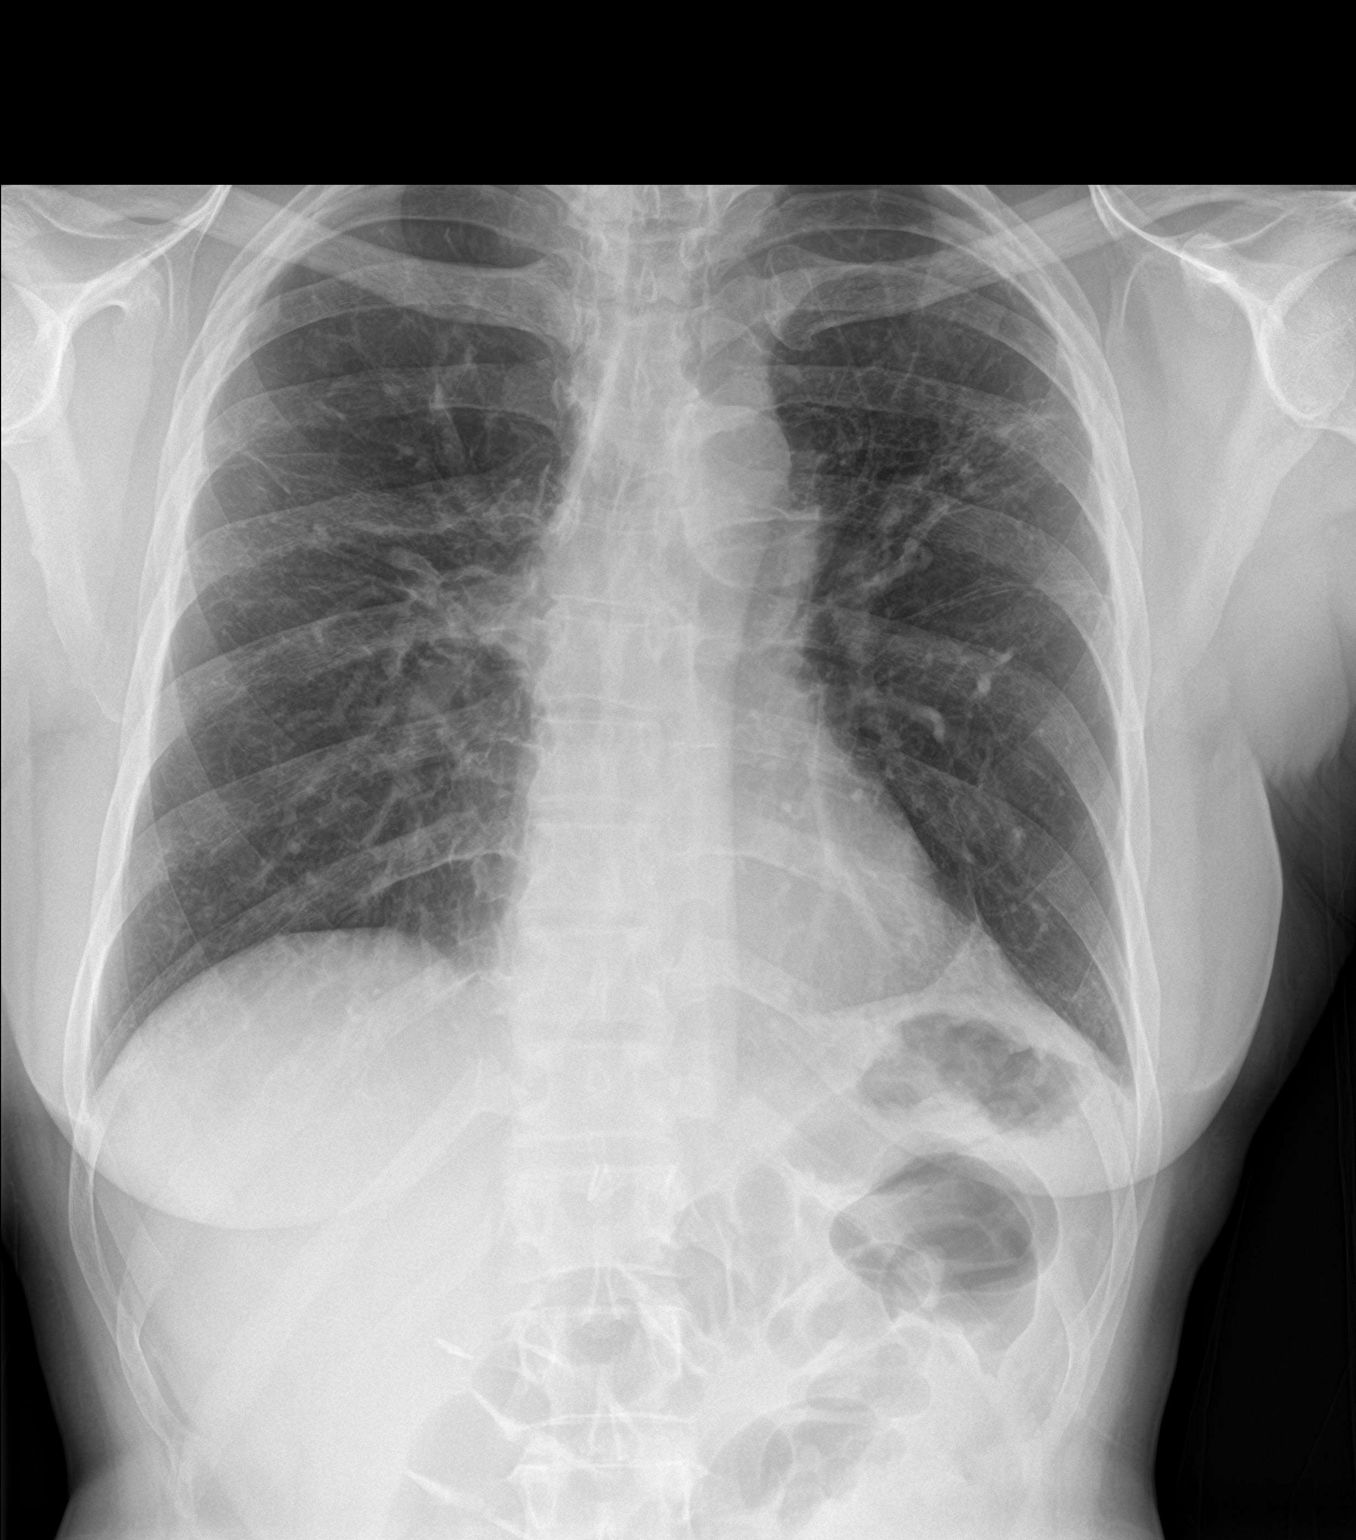

[chest lat]
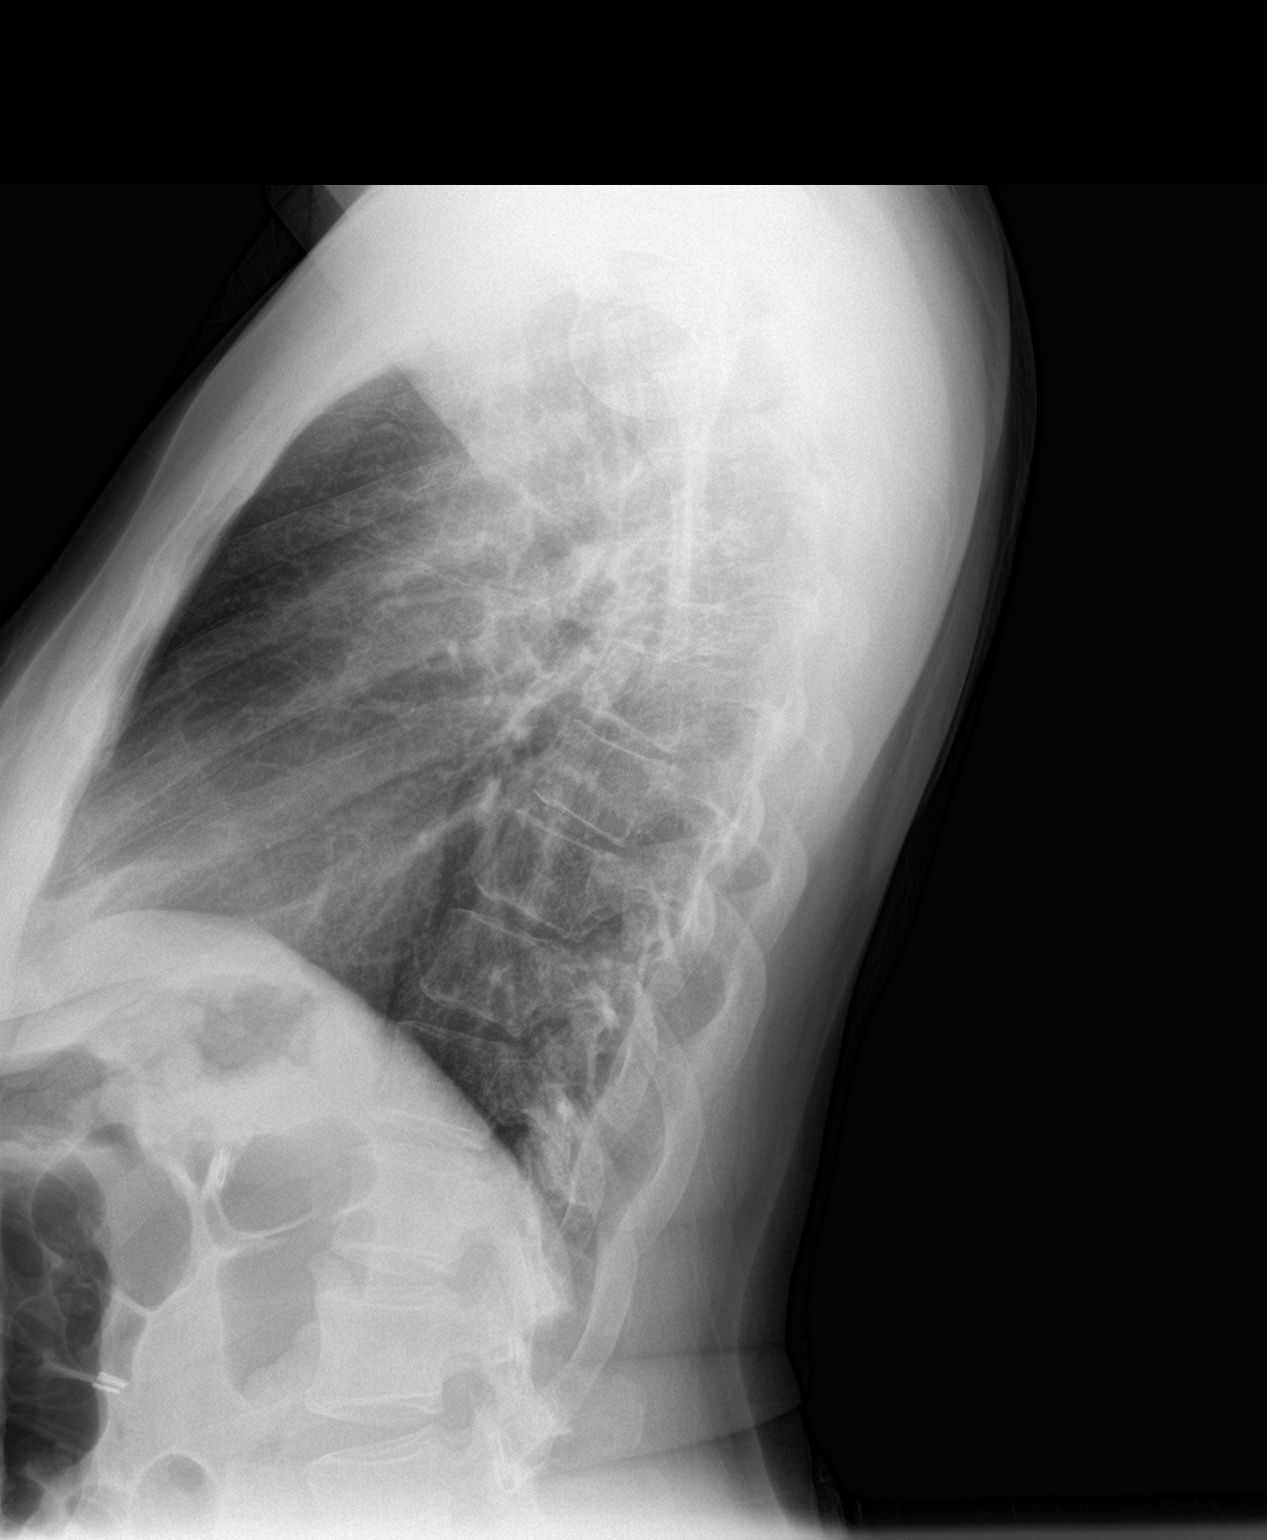

[2 of 2 positions shown; findings below may reference images not displayed]

FINDINGS: Normal heart size and vascularity. Stable bandlike parenchymal
scarring in the hilar regions extending into the upper lobes.
Similar triangular scarring in the left lower lobe. No significant
interval change. No superimposed pneumonia, collapse or
consolidation. Negative for edema, effusion pneumothorax. Trachea is
midline. No acute osseous finding. Remote cholecystectomy noted.
IMPRESSION: Stable parenchymal scarring bilaterally compatible with pulmonary
sarcoidosis.

No superimposed acute process or significant interval change.

## 2018-07-21 ENCOUNTER — Ambulatory Visit (INDEPENDENT_AMBULATORY_CARE_PROVIDER_SITE_OTHER): Payer: Federal, State, Local not specified - PPO

## 2018-07-21 ENCOUNTER — Ambulatory Visit: Payer: Federal, State, Local not specified - PPO | Admitting: Emergency Medicine

## 2018-07-21 ENCOUNTER — Encounter: Payer: Self-pay | Admitting: Emergency Medicine

## 2018-07-21 ENCOUNTER — Other Ambulatory Visit: Payer: Self-pay

## 2018-07-21 VITALS — BP 114/70 | HR 68 | Temp 97.8°F | Ht 68.0 in | Wt 154.4 lb

## 2018-07-21 DIAGNOSIS — J309 Allergic rhinitis, unspecified: Secondary | ICD-10-CM | POA: Insufficient documentation

## 2018-07-21 DIAGNOSIS — J301 Allergic rhinitis due to pollen: Secondary | ICD-10-CM | POA: Diagnosis not present

## 2018-07-21 DIAGNOSIS — D869 Sarcoidosis, unspecified: Secondary | ICD-10-CM

## 2018-07-21 HISTORY — DX: Allergic rhinitis, unspecified: J30.9

## 2018-07-21 NOTE — Progress Notes (Signed)
Subjective:    Patient ID: Stefanie Orozco, female    DOB: 19-Oct-1960, 58 y.o.   MRN: [id number]  HPI 58 year old woman has been followed in the past by Dr Gwenette Greet for probable sarcoidosis based on her chest x-ray and elevated ace level. She also his possible mild obstructive disease based on current flow volume loop on pulmonary function testing. The last was done July 2016. She has been on [facility] since 2014.  She has albuterol prn - uses it most mornings and sometimes w exertion. Her last CXR was 05/2014. She hasn't had a flare in several years. No skin manifestations.   ROV 07/27/16 -- Patient has a history of probable sarcoidosis based on radiology and an elevated Ace level. She underwent pulmonary function testing today that I have reviewed. This shows evidence primarily for restriction both on spirometry and on lung volumes. There may be some subtle evidence for coexisting obstruction based on the curve of her flow volume loop. Diffusion capacity is decreased but corrects to the normal range when adjusted for alveolar volume. No significant change from her prior PFTs in 2016. She is on Dulera 1 puff bid, uses ProAir about once a day, first thing in the am. No prednisone since last time. She has been treated for sinusitis since last time, usually during the pollen season. Exertional tolerance is good. No cough, no wheeze. No rash. She sees opthamologist annually.   ROV  07/22/17 --this is an annual follow-up visit for patient with a history of presumed sarcoidosis based on elevated ACE level and her chest x-ray findings.  She has restriction on pulmonary function testing with some possible subtle evidence for coexisting obstruction based on curve of her flow volume loop. She has been evaluated for some dull abd pain, underwent a CT abdomen recently. She has been well, no changes in her breathing, no cough or sputum. She remains on Dulera. She uses albuterol more in the pollen season. She goes back and  forth between here and [place].   ROV 07/21/2018 --pleasant 58 year old woman with a history of presumed sarcoidosis, never biopsied but characteristic imaging and elevated ACE level.  Also with mild obstructive lung disease.  We have been following her with annual chest x-rays, last was 1 year ago with stable parenchymal scarring and no evidence of progression.  She reports that overall she has been well, has been dealing with allergies - drainage, has some cough at night, tickle in throat . She alternates between zyrtec, xyzal, allegra. Occasional saline rinses occasionally. She was treated for possible sinusitis in March and once last Fall. no steroids.   No functional limitations. She walks several miles a day. No rash. She does get her optho exam annually. Uses albuterol first thing in the am, then [facility]. Has cut the Dulera to qd. Sometimes uses albuterol in the afternoon but not every day.     Review of Systems As per HPI  Past Medical History:  Diagnosis Date  . GERD (gastroesophageal reflux disease)   . Hidradenitis   . Pinched nerve in neck   . Pulmonary sarcoidosis ([place])   . Sarcoid 1998   ACE> 400, compatible cxr     Family History  Problem Relation Age of Onset  . Stroke Mother   . Hypertension Mother   . Hypertension Father      Social History   Socioeconomic History  . Marital status: Married    Spouse name: Not on file  . Number of children: Not on  file  . Years of education: Not on file  . Highest education level: Not on file  Occupational History  . Not on file  Social Needs  . Financial resource strain: Not on file  . Food insecurity    Worry: Not on file    Inability: Not on file  . Transportation needs    Medical: Not on file    Non-medical: Not on file  Tobacco Use  . Smoking status: Former Smoker    Packs/day: 0.50    Types: Cigarettes    Quit date: 01/29/1985    Years since quitting: 33.4  . Smokeless tobacco: Never Used  Substance and Sexual  Activity  . Alcohol use: No  . Drug use: No  . Sexual activity: Not on file  Lifestyle  . Physical activity    Days per week: Not on file    Minutes per session: Not on file  . Stress: Not on file  Relationships  . Social Herbalist on phone: Not on file    Gets together: Not on file    Attends religious service: Not on file    Active member of club or organization: Not on file    Attends meetings of clubs or organizations: Not on file    Relationship status: Not on file  . Intimate partner violence    Fear of current or ex partner: Not on file    Emotionally abused: Not on file    Physically abused: Not on file    Forced sexual activity: Not on file  Other Topics Concern  . Not on file  Social History Narrative  . Not on file     Allergies  Allergen Reactions  . Codeine      Outpatient Medications Prior to Visit  Medication Sig Dispense Refill  . albuterol (PROAIR HFA) 108 (90 Base) MCG/ACT inhaler 1-2 puffs every 4-6 hours as needed 1 Inhaler 11  . Chlorzoxazone (LORZONE) 750 MG TABS Take 1 tablet by mouth daily.    . diclofenac (FLECTOR) 1.3 % PTCH Place 1 patch onto the skin daily.    . Doxepin HCl (SILENOR) 6 MG TABS Take 1 tablet by mouth daily.    . fexofenadine-pseudoephedrine (ALLEGRA-D) 60-120 MG per tablet Take 1 tablet by mouth. Take one two times a day as needed      . mometasone-formoterol (DULERA) 100-5 MCG/ACT AERO Inhale 2 puffs into the lungs 2 (two) times daily. 1 Inhaler 11  . Multiple Vitamin (MULTIVITAMIN WITH MINERALS) TABS tablet Take 1 tablet by mouth daily.    . NON FORMULARY Lidiapincum- OTC herbal supplement for hot flashes 1 tab bid    . omeprazole (PRILOSEC) 40 MG capsule Take 40 mg by mouth daily.     No facility-administered medications prior to visit.           Objective:   Physical Exam Vitals:   07/21/18 0929  BP: 114/70  Pulse: 68  Temp: 97.8 F (36.6 C)  TempSrc: Oral  SpO2: 97%  Weight: 154 lb 6.4 oz (70  kg)  Height: 5\' 8"  (1.727 m)   Gen: Pleasant, well-nourished, in no distress,  normal affect  ENT: No lesions,  mouth clear,  oropharynx clear, no postnasal drip  Neck: No JVD, no stridor  Lungs: No use of accessory muscles, clear bilaterally  Cardiovascular: RRR, heart sounds normal, no murmur or gallops, no peripheral edema  Musculoskeletal: No deformities, no cyanosis or clubbing  Neuro: alert, non focal  Skin: Warm, no lesions or rashes      Assessment & Plan:  SARCOIDOSIS, PULMONARY Clinically stable without any overt flares.  No rash, no neurological symptoms.  Ophthalmology exams have been reassuring and her chest x-ray has been stable.  Plan to follow her chest x-ray today.  She knows to call if any evidence of exacerbation, progressive dyspnea, etc. if present then we will perform CT chest  Okay to continue your Dulera 2 puffs once daily.  If you find that you are using your albuterol more frequently then I would like for you to increase the Dulera back to 2 puffs twice a day.  Remember to rinse and gargle after using this medicine. Keep your albuterol available to use 2 puffs if needed for shortness of breath, chest tightness, wheezing. Chest x-ray today Remember to follow-up with ophthalmology annually. Get the flu shot in the fall Follow with Dr. Lamonte Sakai in 12 months or sooner if you have any problems.  Allergic rhinitis Seasonal, has had periods of flaring, sometimes associated with sinusitis-like symptoms.  She is been treated with antibiotics twice since last visit for flares like this.  I do not think these were overt bronchiectatic or bronchospastic flares, more consistent with sinusitis.  Could consider addition of nasal steroid in the future.  Continue taking your allergy pill (Xyzal, Zyrtec or Allegra) once daily. Agree with nasal saline rinses when you need them.  Baltazar Apo, MD, PhD 07/21/2018, 9:45 AM [place] [facility] [phone] or if no  answer [phone]

## 2018-07-21 NOTE — Patient Instructions (Signed)
Okay to continue your Dulera 2 puffs once daily.  If you find that you are using your albuterol more frequently then I would like for you to increase the Dulera back to 2 puffs twice a day.  Remember to rinse and gargle after using this medicine. Keep your albuterol available to use 2 puffs if needed for shortness of breath, chest tightness, wheezing. Chest x-ray today Remember to follow-up with ophthalmology annually. Get the flu shot in the fall Continue taking your allergy pill (Xyzal, Zyrtec or Allegra) once daily. Agree with nasal saline rinses when you need them. Follow with Dr. Lamonte Sakai in 12 months or sooner if you have any problems.

## 2018-07-21 NOTE — Assessment & Plan Note (Signed)
Clinically stable without any overt flares.  No rash, no neurological symptoms.  Ophthalmology exams have been reassuring and her chest x-ray has been stable.  Plan to follow her chest x-ray today.  She knows to call if any evidence of exacerbation, progressive dyspnea, etc. if present then we will perform CT chest  Okay to continue your Dulera 2 puffs once daily.  If you find that you are using your albuterol more frequently then I would like for you to increase the Dulera back to 2 puffs twice a day.  Remember to rinse and gargle after using this medicine. Keep your albuterol available to use 2 puffs if needed for shortness of breath, chest tightness, wheezing. Chest x-ray today Remember to follow-up with ophthalmology annually. Get the flu shot in the fall Follow with Dr. Lamonte Sakai in 12 months or sooner if you have any problems.

## 2018-07-21 NOTE — Assessment & Plan Note (Signed)
Seasonal, has had periods of flaring, sometimes associated with sinusitis-like symptoms.  She is been treated with antibiotics twice since last visit for flares like this.  I do not think these were overt bronchiectatic or bronchospastic flares, more consistent with sinusitis.  Could consider addition of nasal steroid in the future.  Continue taking your allergy pill (Xyzal, Zyrtec or Allegra) once daily. Agree with nasal saline rinses when you need them.

## 2018-08-25 ENCOUNTER — Other Ambulatory Visit: Payer: Self-pay | Admitting: Emergency Medicine

## 2019-03-19 ENCOUNTER — Ambulatory Visit: Payer: Self-pay | Admitting: Neurology

## 2019-04-27 ENCOUNTER — Telehealth: Payer: Self-pay | Admitting: *Deleted

## 2019-04-27 ENCOUNTER — Ambulatory Visit: Payer: Self-pay | Admitting: Neurology

## 2019-04-27 NOTE — Telephone Encounter (Signed)
No showed new patient appointment. 

## 2019-06-04 ENCOUNTER — Ambulatory Visit: Payer: Self-pay | Admitting: Neurology

## 2019-07-20 ENCOUNTER — Other Ambulatory Visit: Payer: Self-pay | Admitting: Emergency Medicine

## 2019-10-02 ENCOUNTER — Ambulatory Visit (INDEPENDENT_AMBULATORY_CARE_PROVIDER_SITE_OTHER): Payer: Managed Care, Other (non HMO) | Admitting: Physician Assistant

## 2019-10-02 ENCOUNTER — Other Ambulatory Visit: Payer: Self-pay

## 2019-10-02 ENCOUNTER — Encounter: Payer: Self-pay | Admitting: Physician Assistant

## 2019-10-02 DIAGNOSIS — L720 Epidermal cyst: Secondary | ICD-10-CM

## 2019-10-02 DIAGNOSIS — L910 Hypertrophic scar: Secondary | ICD-10-CM

## 2019-10-02 MED ORDER — BETAMETHASONE DIPROPIONATE 0.05 % EX CREA: TOPICAL_CREAM | Freq: Every day | CUTANEOUS | 3 refills | Status: DC

## 2019-10-02 MED ORDER — TRIAMCINOLONE ACETONIDE 40 MG/ML IJ SUSP
40.0000 mg | Freq: Once | INTRAMUSCULAR | Status: AC
  Administered 2019-10-02: 40 mg

## 2019-10-02 NOTE — Progress Notes (Signed)
   Follow-Up Visit   Subjective  Stefanie Orozco is a 59 y.o. AA female who presents for the following: Skin Problem (spot on both breast--spot on left armpit--check lipoma under left rib area).   The following portions of the chart were reviewed this encounter and updated as appropriate: Tobacco  Allergies  Meds  Problems  Med Hx  Surg Hx  Fam Hx      Objective  Well appearing patient in no apparent distress; mood and affect are within normal limits.  A focused examination was performed including chest,suprapubic and face. Relevant physical exam findings are noted in the Assessment and Plan.  Objective  Right Breast (4), Right suprapubic: Firm pink dermal papules/plaques c/w keloid.   Objective  Left axillae: PIH with resolved cyst  Assessment & Plan  Keloid (5) Right Breast (4); Right suprapubic  Intralesional injection - Right Breast, Right suprapubic Location: R breast, R suprapubic  Informed Consent: Discussed risks (infection, pain, bleeding, bruising, thinning of the skin, loss of skin pigment, lack of resolution, and recurrence of lesion) and benefits of the procedure, as well as the alternatives. Informed consent was obtained. Preparation: The area was prepared a standard fashion.  Anesthesia:none  Procedure Details: An intralesional injection was performed with Kenalog 40 mg/cc. 0.5 cc in total were injected.  Total number of injections: 6  Plan: The patient was instructed on post-op care. Recommend OTC analgesia as needed for pain.   triamcinolone acetonide (KENALOG-40) injection 40 mg - Right Breast, Right suprapubic  betamethasone dipropionate 0.05 % cream - Right Breast, Right suprapubic  Epidermal cyst Left axillae  RTC if recurs   I, Blondina Coderre, PA-C, have reviewed all documentation's for this visit.  The documentation on 10/02/19 for the exam, diagnosis, procedures and orders are all accurate and complete.

## 2019-10-07 ENCOUNTER — Other Ambulatory Visit: Payer: Self-pay | Admitting: Emergency Medicine

## 2019-10-15 ENCOUNTER — Encounter: Payer: Self-pay | Admitting: Emergency Medicine

## 2019-10-15 ENCOUNTER — Other Ambulatory Visit: Payer: Self-pay

## 2019-10-15 ENCOUNTER — Ambulatory Visit (INDEPENDENT_AMBULATORY_CARE_PROVIDER_SITE_OTHER): Payer: Managed Care, Other (non HMO)

## 2019-10-15 ENCOUNTER — Ambulatory Visit (INDEPENDENT_AMBULATORY_CARE_PROVIDER_SITE_OTHER): Payer: Managed Care, Other (non HMO) | Admitting: Emergency Medicine

## 2019-10-15 VITALS — BP 128/90 | HR 94 | Temp 98.0°F | Ht 67.0 in | Wt 161.0 lb

## 2019-10-15 DIAGNOSIS — D869 Sarcoidosis, unspecified: Secondary | ICD-10-CM

## 2019-10-15 DIAGNOSIS — J301 Allergic rhinitis due to pollen: Secondary | ICD-10-CM

## 2019-10-15 DIAGNOSIS — Z23 Encounter for immunization: Secondary | ICD-10-CM | POA: Diagnosis not present

## 2019-10-15 MED ORDER — ALBUTEROL SULFATE HFA 108 (90 BASE) MCG/ACT IN AERS: INHALATION_SPRAY | RESPIRATORY_TRACT | 11 refills | Status: DC

## 2019-10-15 NOTE — Assessment & Plan Note (Signed)
With associated mild obstruction.  She missed the [facility] so she is now back on it once a day.  She uses albuterol once a day prior to the [facility].  Rarely needs it otherwise.  She had 1 prednisone pack since her last visit.  Overall stable.  Plan to repeat her chest x-ray for stability and follow annually

## 2019-10-15 NOTE — Assessment & Plan Note (Signed)
Has used different antihistamines with currently using AllegraD.  She uses nasal saline rinses as needed.  We discussed the possible nasal steroid but she would like to defer for now.

## 2019-10-15 NOTE — Addendum Note (Signed)
Addended by: Lia Foyer R on: 10/15/2019 09:48 AM   Modules accepted: Orders

## 2019-10-15 NOTE — Progress Notes (Signed)
Subjective:    Patient ID: Stefanie Orozco, female    DOB: 1960-11-24, 59 y.o.   MRN: [id number]  HPI  ROV 10/15/19 --follow-up visit 59 year old woman with presumed sarcoid based on imaging, elevated ACE level (never biopsied).  She has associated mild obstructive lung disease.  We have been following her with annual chest x-rays and she will need a chest x-ray today.  Last year we stopped her Dulera to see if she would miss it - she noticed that her breathing declined, so she went back to 2 puffs qam.  She has albuterol to use as needed, about once a day in the am.  She reports that she is doing fairly well. She had a sinusitis last year, HA, nasal mucous, URI sx - received pred taper with improvement. She has a lot of nasal congestion is also on nonsedating antihistamine, currently using Allegra-D. She is not interested in nasal steroid. She uses [place] prn. No rash, no LAD noted. Good functional capacity.  COVID vaccine up to date. Needs flu shot.     Review of Systems As per HPI  Past Medical History:  Diagnosis Date  . GERD (gastroesophageal reflux disease)   . Hidradenitis   . Pinched nerve in neck   . Pulmonary sarcoidosis ([place])   . Sarcoid 1998   ACE> 400, compatible cxr     Family History  Problem Relation Age of Onset  . Stroke Mother   . Hypertension Mother   . Hypertension Father      Social History   Socioeconomic History  . Marital status: Married    Spouse name: Not on file  . Number of children: Not on file  . Years of education: Not on file  . Highest education level: Not on file  Occupational History  . Not on file  Tobacco Use  . Smoking status: Former Smoker    Packs/day: 0.50    Types: Cigarettes    Quit date: 01/29/1985    Years since quitting: 34.7  . Smokeless tobacco: Never Used  Vaping Use  . Vaping Use: Never used  Substance and Sexual Activity  . Alcohol use: No  . Drug use: No  . Sexual activity: Not on file  Other Topics Concern  . Not  on file  Social History Narrative  . Not on file   Social Determinants of Health   Financial Resource Strain:   . Difficulty of Paying Living Expenses: Not on file  Food Insecurity:   . Worried About Charity fundraiser in the Last Year: Not on file  . Ran Out of Food in the Last Year: Not on file  Transportation Needs:   . Lack of Transportation (Medical): Not on file  . Lack of Transportation (Non-Medical): Not on file  Physical Activity:   . Days of Exercise per Week: Not on file  . Minutes of Exercise per Session: Not on file  Stress:   . Feeling of Stress : Not on file  Social Connections:   . Frequency of Communication with Friends and Family: Not on file  . Frequency of Social Gatherings with Friends and Family: Not on file  . Attends Religious Services: Not on file  . Active Member of Clubs or Organizations: Not on file  . Attends Archivist Meetings: Not on file  . Marital Status: Not on file  Intimate Partner Violence:   . Fear of Current or Ex-Partner: Not on file  . Emotionally Abused: Not on  file  . Physically Abused: Not on file  . Sexually Abused: Not on file     Allergies  Allergen Reactions  . Codeine      Outpatient Medications Prior to Visit  Medication Sig Dispense Refill  . albuterol (VENTOLIN HFA) 108 (90 Base) MCG/ACT inhaler INHALE 1 TO 2 PUFFS BY MOUTH EVERY 4 TO 6 HOURS AS NEEDED 8.5 g 11  . betamethasone dipropionate 0.05 % cream Apply topically daily. 45 g 3  . diclofenac (FLECTOR) 1.3 % PTCH Place 1 patch onto the skin daily.    . DULERA 100-5 MCG/ACT AERO TAKE 2 PUFFS BY MOUTH TWICE A DAY 13 g 5  . fexofenadine-pseudoephedrine (ALLEGRA-D) 60-120 MG per tablet Take 1 tablet by mouth. Take one two times a day as needed      . Multiple Vitamin (MULTIVITAMIN WITH MINERALS) TABS tablet Take 1 tablet by mouth daily.    . NON FORMULARY Lidiapincum- OTC herbal supplement for hot flashes 1 tab bid    . omeprazole (PRILOSEC) 40 MG capsule  Take 40 mg by mouth daily.    . Chlorzoxazone (LORZONE) 750 MG TABS Take 1 tablet by mouth daily.    . Doxepin HCl (SILENOR) 6 MG TABS Take 1 tablet by mouth daily.      No facility-administered medications prior to visit.          Objective:   Physical Exam Vitals:   10/15/19 0914  BP: 128/90  Pulse: 94  Temp: 98 F (36.7 C)  TempSrc: Temporal  SpO2: 97%  Weight: 161 lb (73 kg)  Height: 5\' 7"  (1.702 m)   Gen: Pleasant, well-nourished, in no distress,  normal affect  ENT: No lesions,  mouth clear,  oropharynx clear, no postnasal drip  Neck: No JVD, no stridor  Lungs: No use of accessory muscles, clear bilaterally  Cardiovascular: RRR, heart sounds normal, no murmur or gallops, no peripheral edema  Musculoskeletal: No deformities, no cyanosis or clubbing  Neuro: alert, non focal  Skin: Warm, no lesions or rashes      Assessment & Plan:  SARCOIDOSIS, PULMONARY With associated mild obstruction.  She missed the [facility] so she is now back on it once a day.  She uses albuterol once a day prior to the [facility].  Rarely needs it otherwise.  She had 1 prednisone pack since her last visit.  Overall stable.  Plan to repeat her chest x-ray for stability and follow annually  Allergic rhinitis Has used different antihistamines with currently using AllegraD.  She uses nasal saline rinses as needed.  We discussed the possible nasal steroid but she would like to defer for now.  Baltazar Apo, MD, PhD 10/15/2019, 9:27 AM  Pulmonary and Critical Care [phone] or if no answer [phone]

## 2019-10-15 NOTE — Patient Instructions (Addendum)
Plan to repeat your chest x-ray today to follow your sarcoidosis. Continue Dulera 2 puffs once a day. Rinse and gargle after using.  Keep albuterol available use 2 puffs if needed for shortness of breath, chest tightness, wheezing. Continue Allegra-D as you have been taking it Use your nasal saline washes as needed Follow with Dr. Lamonte Sakai in 12 months or sooner if you have any problems.

## 2019-10-24 ENCOUNTER — Emergency Department (HOSPITAL_BASED_OUTPATIENT_CLINIC_OR_DEPARTMENT_OTHER): Payer: Managed Care, Other (non HMO)

## 2019-10-24 ENCOUNTER — Emergency Department (HOSPITAL_BASED_OUTPATIENT_CLINIC_OR_DEPARTMENT_OTHER)
Admission: EM | Admit: 2019-10-24 | Discharge: 2019-10-24 | Disposition: A | Discharge status: Home / Self Care | Payer: Managed Care, Other (non HMO) | Attending: Emergency Medicine | Admitting: Emergency Medicine

## 2019-10-24 ENCOUNTER — Emergency Department
Admission: EM | Admit: 2019-10-24 | Discharge: 2019-10-24 | Discharge status: Against Medical Advice | Payer: Managed Care, Other (non HMO) | Source: Home / Self Care

## 2019-10-24 ENCOUNTER — Encounter (HOSPITAL_BASED_OUTPATIENT_CLINIC_OR_DEPARTMENT_OTHER): Payer: Self-pay | Admitting: Emergency Medicine

## 2019-10-24 ENCOUNTER — Other Ambulatory Visit: Payer: Self-pay

## 2019-10-24 DIAGNOSIS — Z87891 Personal history of nicotine dependence: Secondary | ICD-10-CM | POA: Diagnosis not present

## 2019-10-24 DIAGNOSIS — M545 Low back pain, unspecified: Secondary | ICD-10-CM

## 2019-10-24 MED ORDER — CYCLOBENZAPRINE HCL 10 MG PO TABS: 10.0000 mg | ORAL_TABLET | Freq: Two times a day (BID) | ORAL | 0 refills | Status: DC | PRN

## 2019-10-24 MED ORDER — LIDOCAINE 5 % EX PTCH
1.0000 | MEDICATED_PATCH | CUTANEOUS | Status: DC
  Administered 2019-10-24: 1 via TRANSDERMAL
  Filled 2019-10-24: qty 1

## 2019-10-24 NOTE — ED Provider Notes (Signed)
[facility] EMERGENCY DEPARTMENT Provider Note   CSN: [id number] Arrival date & time: 10/24/19  1702     History Chief Complaint  Patient presents with  . Back Pain    Stefanie Orozco is a 59 y.o. female with past medical history of sciatica, presenting to the emergency department with complaint of lower back and left buttock pain began on Tuesday.  Patient states on Sunday she jumped on the trunk of her car to take a photo with her family member, however her tailbone hit the shin of her car.  This caused a bruise at her tailbone region.  She has been having pain that is worse with sitting, radiating to her left buttock.  She is treated with topical CBD oil, baclofen neck patch, Excedrin Migraine.  No numbness or weakness her extremities, no bowel or bladder incontinence, no saddle paresthesia.  The history is provided by the patient.       Past Medical History:  Diagnosis Date  . GERD (gastroesophageal reflux disease)   . Hidradenitis   . Pinched nerve in neck   . Pulmonary sarcoidosis ([place])   . Sarcoid 1998   ACE> 400, compatible cxr    Patient Active Problem List   Diagnosis Date Noted  . Allergic rhinitis 07/21/2018  . Right arm pain 07/28/2013  . SARCOIDOSIS, PULMONARY 02/20/2007  . GERD 02/20/2007    Past Surgical History:  Procedure Laterality Date  . 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 BREAST SURGERY    . LAPAROSCOPIC TUBAL LIGATION    . laproscopic       OB History   No obstetric history on file.     Family History  Problem Relation Age of Onset  . Stroke Mother   . Hypertension Mother   . Hypertension Father     Social History   Tobacco Use  . Smoking status: Former Smoker    Packs/day: 0.50    Types: Cigarettes    Quit date: 01/29/1985    Years since quitting: 34.7  . Smokeless tobacco: Never Used  Vaping Use  . Vaping Use: Never used  Substance Use Topics  . Alcohol use: No  . Drug use: No    Home Medications Prior to Admission  medications   Medication Sig Start Date End Date Taking? Authorizing Provider  albuterol (VENTOLIN HFA) 108 (90 Base) MCG/ACT inhaler INHALE 1 TO 2 PUFFS BY MOUTH EVERY 4 TO 6 HOURS AS NEEDED 10/15/19   Collene Gobble, MD  betamethasone dipropionate 0.05 % cream Apply topically daily. 10/02/19   Sheffield, Ronalee Red, PA-C  cyclobenzaprine (FLEXERIL) 10 MG tablet Take 1 tablet (10 mg total) by mouth 2 (two) times daily as needed for muscle spasms. 10/24/19   Kayler Buckholtz, [place] N, PA-C  diclofenac (FLECTOR) 1.3 % PTCH Place 1 patch onto the skin daily.    [provider]  DULERA 100-5 MCG/ACT AERO TAKE 2 PUFFS BY MOUTH TWICE A DAY 07/20/19   Byrum, Rose Fillers, MD  fexofenadine-pseudoephedrine (ALLEGRA-D) 60-120 MG per tablet Take 1 tablet by mouth. Take one two times a day as needed      [provider]  Multiple Vitamin (MULTIVITAMIN WITH MINERALS) TABS tablet Take 1 tablet by mouth daily.    [provider]  NON FORMULARY Lidiapincum- OTC herbal supplement for hot flashes 1 tab bid    [provider]  omeprazole (PRILOSEC) 40 MG capsule Take 40 mg by mouth daily.    [provider]  Allergies    Codeine  Review of Systems   Review of Systems  Musculoskeletal: Positive for back pain.  All other systems reviewed and are negative.   Physical Exam Updated Vital Signs BP (!) 158/97   Pulse 67   Temp 98.7 F (37.1 C) (Oral)   Resp 18   SpO2 98%   Physical Exam Vitals and nursing note reviewed.  Constitutional:      General: She is not in acute distress.    Appearance: She is well-developed.  HENT:     Head: Normocephalic and atraumatic.  Eyes:     Conjunctiva/sclera: Conjunctivae normal.  Cardiovascular:     Rate and Rhythm: Normal rate and regular rhythm.  Pulmonary:     Effort: Pulmonary effort is normal.  Abdominal:     General: Bowel sounds are normal.     Tenderness: There is no abdominal tenderness.  Musculoskeletal:     Comments:  Healing bruise to the superior aspect of the gluteal cleft just right of the midline.  There is tenderness to the left gluteal region as well.  Neurological:     Mental Status: She is alert.     Comments: Normal tone.  5/5 strength in BLE including strong and equal dorsiflexion/plantar flexion Sensory: light touch normal in BLE extremities.   Gait: normal gait and balance CV: distal pulses palpable throughout    Psychiatric:        Mood and Affect: Mood normal.        Behavior: Behavior normal.     ED Results / Procedures / Treatments   Labs (all labs ordered are listed, but only abnormal results are displayed) Labs Reviewed - No data to display  EKG None  Radiology DG Sacrum/Coccyx  Result Date: 10/24/2019 CLINICAL DATA:  Back pain.  Tailbone pain. EXAM: SACRUM AND COCCYX - 2+ VIEW COMPARISON:  None. FINDINGS: There is no evidence of fracture or other focal bone lesions. IMPRESSION: Negative. Electronically Signed   By: Fidela Salisbury M.D.   On: 10/24/2019 19:14    Procedures Procedures (including critical care time)  Medications Ordered in ED Medications  lidocaine (LIDODERM) 5 % 1 patch (has no administration in time range)    ED Course  I have reviewed the triage vital signs and the nursing notes.  Pertinent labs & imaging results that were available during my care of the patient were reviewed by me and considered in my medical decision making (see chart for details).    MDM Rules/Calculators/A&P                          Patient with back pain after sitting down on the thin of her trunk on Sunday.  Pain radiates to the left gluteal region, feels similar to history of sciatica..  No neurological deficits and normal neuro exam.  Patient can walk but states is painful.  No loss of bowel or bladder control.  No concern for cauda equina.  X-rays negative for coccyx/sacral fracture. Lidoderm patch applied for pain. Will prescribe flexeril, recommend salon pas patches  with lido, tylenol for pain (hx of gerd), PCP follow up. Pt agreeable to plan, appropriate for discharge.  Discussed results, findings, treatment and follow up. Patient advised of return precautions. Patient verbalized understanding and agreed with plan.  Final Clinical Impression(s) / ED Diagnoses Final diagnoses:  Acute left-sided low back pain, unspecified whether sciatica present    Rx / DC Orders ED Discharge Orders  Ordered    cyclobenzaprine (FLEXERIL) 10 MG tablet  2 times daily PRN        10/24/19 1940           Keaun Schnabel, [place] N, PA-C 10/24/19 1940    Gareth Morgan, MD 10/26/19 [phone]

## 2019-10-24 NOTE — Discharge Instructions (Signed)
Please read instructions below.  You can take tylenol every 6 hours as needed for pain.   Apply ice to your back for 20 minutes at a time.   You can use Salonpas with lidocaine patches directly to the area for pain.  You can find these on the shelf at a pharmacy. You can take Flexeril/cyclobenzaprine every 12 hours as needed for muscle spasm.  Be aware this medication can make you drowsy; do not take while driving or drinking alcohol.   Follow-up with your primary care provider symptoms persist.   Return to ER if new numbness or tingling in your arms or legs, inability to urinate, inability to hold your bowels, or weakness in your extremities.

## 2019-10-24 NOTE — ED Triage Notes (Signed)
Jumped up onto back of car to take photo and hit her tailbone on the fin of the car.

## 2020-02-26 ENCOUNTER — Telehealth: Payer: Self-pay | Admitting: Emergency Medicine

## 2020-02-26 MED ORDER — ALBUTEROL SULFATE HFA 108 (90 BASE) MCG/ACT IN AERS: INHALATION_SPRAY | RESPIRATORY_TRACT | 11 refills | Status: DC

## 2020-02-26 MED ORDER — DULERA 100-5 MCG/ACT IN AERO: INHALATION_SPRAY | RESPIRATORY_TRACT | 5 refills | Status: DC

## 2020-02-26 NOTE — Telephone Encounter (Signed)
Called and spoke with patient, verified [facility] [place] is the [facility] location.  Advised that Ruthe Mannan is no longer being made by the manufacturer and that if the pharmacy does not have any on hand, she will need to call her insurance company and find out what is on the formulary that is comparable and then let us know.  Scripts sent to the pharmacy.

## 2020-02-27 ENCOUNTER — Other Ambulatory Visit: Payer: Self-pay | Admitting: Emergency Medicine

## 2020-10-27 ENCOUNTER — Encounter: Payer: Self-pay | Admitting: Physician Assistant

## 2020-10-27 ENCOUNTER — Ambulatory Visit: Payer: Managed Care, Other (non HMO) | Admitting: Physician Assistant

## 2020-10-27 ENCOUNTER — Other Ambulatory Visit: Payer: Self-pay

## 2020-10-27 DIAGNOSIS — L729 Follicular cyst of the skin and subcutaneous tissue, unspecified: Secondary | ICD-10-CM | POA: Diagnosis not present

## 2020-10-27 DIAGNOSIS — L910 Hypertrophic scar: Secondary | ICD-10-CM

## 2020-10-27 MED ORDER — TRIAMCINOLONE ACETONIDE 40 MG/ML IJ SUSP
40.0000 mg | Freq: Once | INTRAMUSCULAR | Status: AC
  Administered 2020-10-27: 40 mg

## 2020-10-27 MED ORDER — TRIAMCINOLONE ACETONIDE 10 MG/ML IJ SUSP
10.0000 mg | Freq: Once | INTRAMUSCULAR | Status: AC
  Administered 2020-10-27: 10 mg

## 2020-10-28 ENCOUNTER — Ambulatory Visit: Payer: Managed Care, Other (non HMO) | Admitting: Emergency Medicine

## 2020-10-28 ENCOUNTER — Encounter: Payer: Self-pay | Admitting: Emergency Medicine

## 2020-10-28 VITALS — BP 146/80 | HR 77 | Temp 98.2°F | Ht 68.0 in | Wt 161.6 lb

## 2020-10-28 DIAGNOSIS — D869 Sarcoidosis, unspecified: Secondary | ICD-10-CM | POA: Diagnosis not present

## 2020-10-28 DIAGNOSIS — R911 Solitary pulmonary nodule: Secondary | ICD-10-CM | POA: Diagnosis not present

## 2020-10-28 DIAGNOSIS — J301 Allergic rhinitis due to pollen: Secondary | ICD-10-CM

## 2020-10-28 MED ORDER — MONTELUKAST SODIUM 10 MG PO TABS: 10.0000 mg | ORAL_TABLET | Freq: Every day | ORAL | 11 refills | Status: DC

## 2020-10-28 NOTE — Addendum Note (Signed)
Addended by: Konrad Felix L on: 10/28/2020 04:14 PM   Modules accepted: Orders

## 2020-10-28 NOTE — Patient Instructions (Signed)
Continue your Allegra and Allegra-D as you have been using them. Try starting Singulair 10 mg each evening Continue your Dulera 2 puffs once daily.  Rinse and gargle after using. Keep your albuterol available use 2 puffs when needed shortness of breath, chest tightness, wheezing. We will perform pulmonary function testing in next office visit. We will perform a CT scan of your chest to follow your sarcoidosis. Follow Dr. Lamonte Sakai with PFT and we will review your testing.

## 2020-10-28 NOTE — Assessment & Plan Note (Signed)
Continue your Allegra and Allegra-D as you have been using them. Try starting Singulair 10 mg each evening

## 2020-10-28 NOTE — Assessment & Plan Note (Signed)
Continue your Dulera 2 puffs once daily.  Rinse and gargle after using. Keep your albuterol available use 2 puffs when needed shortness of breath, chest tightness, wheezing. We will perform pulmonary function testing in next office visit. We will perform a CT scan of your chest to follow your sarcoidosis. Follow Dr. Lamonte Sakai with PFT and we will review your testing.

## 2020-10-28 NOTE — Progress Notes (Signed)
Subjective:    Patient ID: Stefanie Orozco, female    DOB: 1960/08/02, 60 y.o.   MRN: [id number]  HPI  ROV 10/15/19 --follow-up visit 60 year old woman with presumed sarcoid based on imaging, elevated ACE level (never biopsied).  She has associated mild obstructive lung disease.  We have been following her with annual chest x-rays and she will need a chest x-ray today.  Last year we stopped her Dulera to see if she would miss it - she noticed that her breathing declined, so she went back to 2 puffs qam.  She has albuterol to use as needed, about once a day in the am.  She reports that she is doing fairly well. She had a sinusitis last year, HA, nasal mucous, URI sx - received pred taper with improvement. She has a lot of nasal congestion is also on nonsedating antihistamine, currently using Allegra-D. She is not interested in nasal steroid. She uses [place] prn. No rash, no LAD noted. Good functional capacity.  COVID vaccine up to date. Needs flu shot.    ROV 10/28/20 --60 year old woman with a history of presumed sarcoidosis.  She has associated mild obstructive lung disease.  She had an elevated ACE level and some basilar scar on chest imaging.  Also with allergic rhinitis.  Her most recent PFT were 07/27/2016.  Chest x-ray at 10/15/2019 reassuring. She is on CPAP, uses reliably.  Today she reports that she has been doing fairly well, deals with allergy sx when it is wet. Dry cough associated with that. Uses allegra. Good functional capacity, good exercise tolerance. She has to use albuterol more when allergies are active. Usually uses once a day. She is using Dulera only once a day.     Review of Systems As per HPI  Past Medical History:  Diagnosis Date   GERD (gastroesophageal reflux disease)    Hidradenitis    Pinched nerve in neck    Pulmonary sarcoidosis ([facility])    Sarcoid 1998   ACE> 400, compatible cxr     Family History  Problem Relation Age of Onset   Stroke Mother    Hypertension  Mother    Hypertension Father      Social History   Socioeconomic History   Marital status: Married    Spouse name: Not on file   Number of children: Not on file   Years of education: Not on file   Highest education level: Not on file  Occupational History   Not on file  Tobacco Use   Smoking status: Former    Packs/day: 0.50    Types: Cigarettes    Quit date: 01/29/1985    Years since quitting: 35.7   Smokeless tobacco: Never  Vaping Use   Vaping Use: Never used  Substance and Sexual Activity   Alcohol use: No   Drug use: No   Sexual activity: Not on file  Other Topics Concern   Not on file  Social History Narrative   Not on file   Social Determinants of Health   Financial Resource Strain: Not on file  Food Insecurity: Not on file  Transportation Needs: Not on file  Physical Activity: Not on file  Stress: Not on file  Social Connections: Not on file  Intimate Partner Violence: Not on file     Allergies  Allergen Reactions   Codeine    Almond Oil Other (See Comments)     Outpatient Medications Prior to Visit  Medication Sig Dispense Refill   albuterol (  VENTOLIN HFA) 108 (90 Base) MCG/ACT inhaler INHALE 1 TO 2 PUFFS BY MOUTH EVERY 4 TO 6 HOURS AS NEEDED 8.5 g 11   betamethasone dipropionate 0.05 % cream Apply topically daily. 45 g 3   celecoxib (CELEBREX) 200 MG capsule Take 200 mg by mouth daily as needed.     cyanocobalamin (,VITAMIN B-12,) 1000 MCG/ML injection cyanocobalamin (vit B-12) 1,000 mcg/mL injection solution  Inject 1 mL every month by subcutaneous route.     cyclobenzaprine (FLEXERIL) 10 MG tablet Take 1 tablet (10 mg total) by mouth 2 (two) times daily as needed for muscle spasms. 20 tablet 0   diclofenac (FLECTOR) 1.3 % PTCH Place 1 patch onto the skin daily.     DULERA 100-5 MCG/ACT AERO TAKE 2 PUFFS BY MOUTH TWICE A DAY (Patient taking differently: daily. TAKE 2 PUFFS BY MOUTH TWICE A DAY) 13 each 5   fexofenadine-pseudoephedrine (ALLEGRA-D)  60-120 MG per tablet Take 1 tablet by mouth. Take one two times a day as needed       methocarbamol (ROBAXIN) 500 MG tablet Take 500 mg by mouth 2 (two) times daily as needed.     Multiple Vitamin (MULTIVITAMIN WITH MINERALS) TABS tablet Take 1 tablet by mouth daily.     NON FORMULARY Lidiapincum- OTC herbal supplement for hot flashes 1 tab bid     omeprazole (PRILOSEC OTC) 20 MG tablet Take 20 mg by mouth daily.     omeprazole (PRILOSEC) 40 MG capsule Take 40 mg by mouth daily. (Patient not taking: Reported on 10/28/2020)     No facility-administered medications prior to visit.          Objective:   Physical Exam Vitals:   10/28/20 1535  BP: (!) 146/80  Pulse: 77  Temp: 98.2 F (36.8 C)  TempSrc: Oral  SpO2: 98%  Weight: 161 lb 9.6 oz (73.3 kg)  Height: 5\' 8"  (1.727 m)   Gen: Pleasant, well-nourished, in no distress,  normal affect  ENT: No lesions,  mouth clear,  oropharynx clear, no postnasal drip  Neck: No JVD, no stridor  Lungs: No use of accessory muscles, clear bilaterally  Cardiovascular: RRR, heart sounds normal, no murmur or gallops, no peripheral edema  Musculoskeletal: No deformities, no cyanosis or clubbing  Neuro: alert, non focal  Skin: Warm, no lesions or rashes      Assessment & Plan:  SARCOIDOSIS, PULMONARY Continue your Dulera 2 puffs once daily.  Rinse and gargle after using. Keep your albuterol available use 2 puffs when needed shortness of breath, chest tightness, wheezing. We will perform pulmonary function testing in next office visit. We will perform a CT scan of your chest to follow your sarcoidosis. Follow Dr. Lamonte Sakai with PFT and we will review your testing.  Allergic rhinitis Continue your Allegra and Allegra-D as you have been using them. Try starting Singulair 10 mg each evening  Baltazar Apo, MD, PhD 10/28/2020, 4:06 PM [facility] [phone] or if no answer [phone]

## 2020-11-01 ENCOUNTER — Ambulatory Visit: Payer: Managed Care, Other (non HMO) | Admitting: Physician Assistant

## 2020-11-02 ENCOUNTER — Encounter: Payer: Self-pay | Admitting: Physician Assistant

## 2020-11-02 NOTE — Progress Notes (Addendum)
   Follow-Up Visit   Subjective  Stefanie Orozco is a 61 y.o. female who presents for the following: Skin Problem (Patient here today for HS flare up on her left and right breast. Per patient she just completed a round of antibiotics (SMZ-TMP) that was prescribed by Dr. Garwin Brothers patient states that she's also been using the betamethasone cream. ).   The following portions of the chart were reviewed this encounter and updated as appropriate:  Tobacco  Allergies  Meds  Problems  Med Hx  Surg Hx  Fam Hx      Objective  Well appearing patient in no apparent distress; mood and affect are within normal limits.  All skin waist up examined.  Left Breast Erythematous dermal, soft cyst that has been previously drained.  Right Breast (4) Firm pink dermal plaques c/w keloid.   Assessment & Plan  Cyst of skin Left Breast  Intralesional injection - Left Breast Location: Left Breast  Informed Consent: Discussed risks (infection, pain, bleeding, bruising, thinning of the skin, loss of skin pigment, lack of resolution, and recurrence of lesion) and benefits of the procedure, as well as the alternatives. Informed consent was obtained. Preparation: The area was prepared a standard fashion.  Procedure Details: An intralesional injection was performed with Kenalog 10 mg/cc. 1 cc in total were injected.  Total number of injections: 4  Plan: The patient was instructed on post-op care. Recommend OTC analgesia as needed for pain.   triamcinolone acetonide (KENALOG) 10 MG/ML injection 10 mg - Left Breast   Keloid (4) Right Breast  Intralesional injection - Right Breast Location: Right Breast  Informed Consent: Discussed risks (infection, pain, bleeding, bruising, thinning of the skin, loss of skin pigment, lack of resolution, and recurrence of lesion) and benefits of the procedure, as well as the alternatives. Informed consent was obtained. Preparation: The area was prepared a standard  fashion.  Procedure Details: An intralesional injection was performed with Kenalog 40 mg/cc. 1cc in total were injected.  Total number of injections: 4  Plan: The patient was instructed on post-op care. Recommend OTC analgesia as needed for pain.   triamcinolone acetonide (KENALOG-40) injection 40 mg - Right Breast   Related Medications betamethasone dipropionate 0.05 % cream Apply topically daily.   I, Chelle Cayton, PA-C, have reviewed all documentation's for this visit.  The documentation on 01/03/21 for the exam, diagnosis, procedures and orders are all accurate and complete.

## 2020-11-03 ENCOUNTER — Telehealth: Payer: Self-pay | Admitting: Emergency Medicine

## 2020-11-03 MED ORDER — MONTELUKAST SODIUM 10 MG PO TABS: 10.0000 mg | ORAL_TABLET | Freq: Every day | ORAL | 3 refills | Status: DC

## 2020-11-03 NOTE — Telephone Encounter (Signed)
I have sent in the rx for the [facility]ulair [facility].  Nothing further is needed.

## 2020-11-10 ENCOUNTER — Ambulatory Visit: Payer: Managed Care, Other (non HMO)

## 2020-11-10 ENCOUNTER — Other Ambulatory Visit: Payer: Self-pay

## 2020-11-10 ENCOUNTER — Ambulatory Visit (INDEPENDENT_AMBULATORY_CARE_PROVIDER_SITE_OTHER)
Admission: RE | Admit: 2020-11-10 | Discharge: 2020-11-10 | Disposition: A | Discharge status: Home / Self Care | Payer: Managed Care, Other (non HMO) | Source: Ambulatory Visit | Attending: Emergency Medicine | Admitting: Emergency Medicine

## 2020-11-10 DIAGNOSIS — R911 Solitary pulmonary nodule: Secondary | ICD-10-CM

## 2020-11-11 ENCOUNTER — Other Ambulatory Visit: Payer: Self-pay

## 2020-11-11 ENCOUNTER — Emergency Department
Admission: EM | Admit: 2020-11-11 | Discharge: 2020-11-11 | Disposition: A | Discharge status: Home / Self Care | Payer: Managed Care, Other (non HMO) | Source: Home / Self Care | Attending: Family Medicine | Admitting: Family Medicine

## 2020-11-11 DIAGNOSIS — J069 Acute upper respiratory infection, unspecified: Secondary | ICD-10-CM | POA: Diagnosis not present

## 2020-11-11 MED ORDER — AZITHROMYCIN 250 MG PO TABS: ORAL_TABLET | ORAL | 0 refills | Status: DC

## 2020-11-11 MED ORDER — PREDNISONE 20 MG PO TABS: ORAL_TABLET | ORAL | 0 refills | Status: DC

## 2020-11-11 NOTE — Discharge Instructions (Addendum)
Take plain guaifenesin (1200mg  extended release tabs such as Mucinex) twice daily, with plenty of water, for cough and congestion.  If needed, may add Pseudoephedrine (30mg , one or two every 4 to 6 hours) for sinus congestion.  Get adequate rest.   Continue saline nasal rinse and humidifier.  Continue Singulair and all inhalers as prescribed. Try warm salt water gargles for sore throat.  Stop all antihistamines for now, and other non-prescription cough/cold preparations. May take Delsym Cough Suppressant ("12 Hour Cough Relief") at bedtime for nighttime cough.   If symptoms become significantly worse during the night or over the weekend, proceed to the local emergency room.

## 2020-11-11 NOTE — ED Triage Notes (Signed)
Cough, sore throat, fever, chills x 4 days Vaccinated and Flu shot

## 2020-11-11 NOTE — ED Provider Notes (Signed)
[name]    CSN: [id number] Arrival date & time: 11/11/20  1011      History   Chief Complaint Chief Complaint  Patient presents with   Cough    HPI Stefanie Orozco is a 60 y.o. female.   Patient complains of five day history of typical cold-like symptoms developing over several days, including mild sore throat, sinus congestion, headache, fatigue, myalgias, sweats/chills, and non-productive cough. She has had two negative home COVID19 tests that were negative.  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   She had a follow-up CT chest yesterday but results are not yet available.  Review of chart records reveals a chest x-ray donee 10/15/19 that showed no changes.  The history is provided by the patient.   Past Medical History:  Diagnosis Date   GERD (gastroesophageal reflux disease)    Hidradenitis    Pinched nerve in neck    Pulmonary sarcoidosis ([place])    Sarcoid 1998   ACE> 400, compatible cxr    Patient Active Problem List   Diagnosis Date Noted   Allergic rhinitis 07/21/2018   Right arm pain 07/28/2013   SARCOIDOSIS, PULMONARY 02/20/2007   GERD 02/20/2007    Past Surgical History:  Procedure Laterality Date   ABLATION ON ENDOMETRIOSIS     BREAST SURGERY     LAPAROSCOPIC TUBAL LIGATION     laproscopic      OB History   No obstetric history on file.      Home Medications    Prior to Admission medications   Medication Sig Start Date End Date Taking? Authorizing Provider  azithromycin (ZITHROMAX Z-PAK) 250 MG tablet Take 2 tabs today; then begin one tab once daily for 4 more days. 11/11/20  Yes Kandra Nicolas, MD  predniSONE (DELTASONE) 20 MG tablet Take one tab by mouth twice daily for 4 days, then one daily for 3 days. Take with food. 11/11/20  Yes Kandra Nicolas, MD  albuterol (VENTOLIN HFA) 108 (90 Base) MCG/ACT inhaler INHALE 1 TO 2 PUFFS BY MOUTH EVERY 4 TO 6 HOURS AS NEEDED 02/26/20   Collene Gobble, MD  betamethasone  dipropionate 0.05 % cream Apply topically daily. 10/02/19   Sheffield, Ronalee Red, PA-C  celecoxib (CELEBREX) 200 MG capsule Take 200 mg by mouth daily as needed. 10/06/20   [provider]  diclofenac (FLECTOR) 1.3 % PTCH Place 1 patch onto the skin daily.    [provider]  DULERA 100-5 MCG/ACT AERO TAKE 2 PUFFS BY MOUTH TWICE A DAY Patient taking differently: daily. TAKE 2 PUFFS BY MOUTH TWICE A DAY 02/29/20   Collene Gobble, MD  montelukast (SINGULAIR) 10 MG tablet Take 1 tablet (10 mg total) by mouth at bedtime. 11/03/20   Collene Gobble, MD  Multiple Vitamin (MULTIVITAMIN WITH MINERALS) TABS tablet Take 1 tablet by mouth daily.    [provider]  NON FORMULARY Lidiapincum- OTC herbal supplement for hot flashes 1 tab bid    [provider]  omeprazole (PRILOSEC OTC) 20 MG tablet Take 20 mg by mouth daily. 06/28/20   [provider]  omeprazole (PRILOSEC) 40 MG capsule Take 40 mg by mouth daily. Patient not taking: Reported on 10/28/2020    [provider]    Family History Family History  Problem Relation Age of Onset   Stroke Mother    Hypertension Mother    Hypertension Father     Social History Social History   Tobacco  Use   Smoking status: Former    Packs/day: 0.50    Types: Cigarettes    Quit date: 01/29/1985    Years since quitting: 35.8   Smokeless tobacco: Never  Vaping Use   Vaping Use: Never used  Substance Use Topics   Alcohol use: No   Drug use: No     Allergies   Codeine and Almond oil   Review of Systems Review of Systems + sore throat + cough No pleuritic pain No wheezing + nasal congestion + post-nasal drainage No sinus pain/pressure No itchy/red eyes No earache No hemoptysis No SOB No fever, + chills/sweats No nausea No vomiting No abdominal pain No diarrhea No urinary symptoms No skin rash + fatigue + myalgias + headache Used OTC meds (Mucinex DM) without relief   Physical  Exam Triage Vital Signs ED Triage Vitals  Enc Vitals Group     BP 11/11/20 1044 (!) 143/99     Pulse Rate 11/11/20 1044 79     Resp 11/11/20 1044 18     Temp 11/11/20 1044 98.2 F (36.8 C)     Temp Source 11/11/20 1044 Oral     SpO2 11/11/20 1044 97 %     Weight 11/11/20 1045 160 lb (72.6 kg)     Height 11/11/20 1045 5\' 7"  (1.702 m)     Head Circumference --      Peak Flow --      Pain Score 11/11/20 1044 7     Pain Loc --      Pain Edu? --      Excl. in [place]? --    No data found.  Updated Vital Signs BP (!) 143/99 (BP Location: Left Arm)   Pulse 79   Temp 98.2 F (36.8 C) (Oral)   Resp 18   Ht 5\' 7"  (1.702 m)   Wt 72.6 kg   SpO2 97%   BMI 25.06 kg/m   Visual Acuity Right Eye Distance:   Left Eye Distance:   Bilateral Distance:    Right Eye Near:   Left Eye Near:    Bilateral Near:     Physical Exam Nursing notes and Vital Signs reviewed. Appearance:  Patient appears stated age, and in no acute distress Eyes:  Pupils are equal, round, and reactive to light and accomodation.  Extraocular movement is intact.  Conjunctivae are not inflamed  Ears:  Canals normal.  Tympanic membranes normal.  Nose:  Mildly congested turbinates.  No sinus tenderness.  Pharynx:  Normal Neck:  Supple.  Mildly enlarged lateral nodes are present, tender to palpation on the left.   Lungs:  Clear to auscultation.  Breath sounds are equal.  Moving air well. Heart:  Regular rate and rhythm without murmurs, rubs, or gallops.  Abdomen:  Nontender without masses or hepatosplenomegaly.  Bowel sounds are present.  No CVA or flank tenderness.  Extremities:  No edema.  Skin:  No rash present.   UC Treatments / Results  Labs (all labs ordered are listed, but only abnormal results are displayed) Labs Reviewed - No data to display  EKG   Radiology No results found.  Procedures Procedures (including critical care time)  Medications Ordered in UC Medications - No data to display  Initial  Impression / Assessment and Plan / UC Course  I have reviewed the triage vital signs and the nursing notes.  Pertinent labs & imaging results that were available during my care of the patient were reviewed by me and considered  in my medical decision making (see chart for details).    Benign exam.  Patient has a history of pulmonary sarcoidosis, stable.  Begin empiric Z-pak and prednisone burst/taper. Followup with Dr. Lamonte Sakai if not improved one week.  Final Clinical Impressions(s) / UC Diagnoses   Final diagnoses:  Viral URI with cough     Discharge Instructions      Take plain guaifenesin (1200mg  extended release tabs such as Mucinex) twice daily, with plenty of water, for cough and congestion.  If needed, may add Pseudoephedrine (30mg , one or two every 4 to 6 hours) for sinus congestion.  Get adequate rest.   Continue saline nasal rinse and humidifier.  Continue Singulair and all inhalers as prescribed. Try warm salt water gargles for sore throat.  Stop all antihistamines for now, and other non-prescription cough/cold preparations. May take Delsym Cough Suppressant ("12 Hour Cough Relief") at bedtime for nighttime cough.   If symptoms become significantly worse during the night or over the weekend, proceed to the local emergency room.      ED Prescriptions     Medication Sig Dispense Auth. Provider   azithromycin (ZITHROMAX Z-PAK) 250 MG tablet Take 2 tabs today; then begin one tab once daily for 4 more days. 6 tablet Kandra Nicolas, MD   predniSONE (DELTASONE) 20 MG tablet Take one tab by mouth twice daily for 4 days, then one daily for 3 days. Take with food. 11 tablet Kandra Nicolas, MD         Kandra Nicolas, MD 11/13/20 [phone]

## 2020-12-09 ENCOUNTER — Ambulatory Visit: Payer: Managed Care, Other (non HMO) | Admitting: Emergency Medicine

## 2020-12-09 ENCOUNTER — Ambulatory Visit (INDEPENDENT_AMBULATORY_CARE_PROVIDER_SITE_OTHER): Payer: Managed Care, Other (non HMO) | Admitting: Emergency Medicine

## 2020-12-09 ENCOUNTER — Other Ambulatory Visit: Payer: Self-pay

## 2020-12-09 ENCOUNTER — Encounter: Payer: Self-pay | Admitting: Emergency Medicine

## 2020-12-09 DIAGNOSIS — D869 Sarcoidosis, unspecified: Secondary | ICD-10-CM | POA: Diagnosis not present

## 2020-12-09 DIAGNOSIS — R911 Solitary pulmonary nodule: Secondary | ICD-10-CM

## 2020-12-09 DIAGNOSIS — K219 Gastro-esophageal reflux disease without esophagitis: Secondary | ICD-10-CM

## 2020-12-09 DIAGNOSIS — R053 Chronic cough: Secondary | ICD-10-CM

## 2020-12-09 DIAGNOSIS — J301 Allergic rhinitis due to pollen: Secondary | ICD-10-CM | POA: Diagnosis not present

## 2020-12-09 HISTORY — DX: Chronic cough: R05.3

## 2020-12-09 LAB — PULMONARY FUNCTION TEST
DL/VA % pred: 96 %
DL/VA: 4.01 ml/min/mmHg/L
DLCO cor % pred: 67 %
DLCO cor: 14.93 ml/min/mmHg
DLCO unc % pred: 67 %
DLCO unc: 14.93 ml/min/mmHg
FEF 25-75 Post: 2.57 L/sec
FEF 25-75 Pre: 2.54 L/sec
FEF2575-%Change-Post: 1 %
FEF2575-%Pred-Post: 114 %
FEF2575-%Pred-Pre: 112 %
FEV1-%Change-Post: 0 %
FEV1-%Pred-Post: 102 %
FEV1-%Pred-Pre: 102 %
FEV1-Post: 2.37 L
FEV1-Pre: 2.39 L
FEV1FVC-%Change-Post: -1 %
FEV1FVC-%Pred-Pre: 104 %
FEV6-%Change-Post: 1 %
FEV6-%Pred-Post: 101 %
FEV6-%Pred-Pre: 100 %
FEV6-Post: 2.9 L
FEV6-Pre: 2.86 L
FEV6FVC-%Change-Post: 0 %
FEV6FVC-%Pred-Post: 103 %
FEV6FVC-%Pred-Pre: 102 %
FVC-%Change-Post: 0 %
FVC-%Pred-Post: 98 %
FVC-%Pred-Pre: 97 %
FVC-Post: 2.91 L
FVC-Pre: 2.88 L
Post FEV1/FVC ratio: 81 %
Post FEV6/FVC ratio: 100 %
Pre FEV1/FVC ratio: 83 %
Pre FEV6/FVC Ratio: 99 %
RV % pred: 64 %
RV: 1.36 L
TLC % pred: 76 %
TLC: 4.18 L

## 2020-12-09 NOTE — Patient Instructions (Signed)
We reviewed your pulmonary function testing and your CT chest today.  These are stable compared with priors.  Good news.  No evidence for active sarcoidosis. Please continue your Dulera 2 puffs once daily.  Rinse and gargle after using. Keep your albuterol available to use 2 puffs when you needed for shortness of breath, chest tightness, wheezing. Continue your Singulair 10 mg each evening Restart your Allegra once daily at least through the fall allergy season. Take your omeprazole 20 mg once daily every day for about a week.  Then you can go back to using it just as needed. Follow with Dr. Lamonte Sakai in 12 months or sooner if you have any problems.

## 2020-12-09 NOTE — Progress Notes (Signed)
PFT done today. 

## 2020-12-09 NOTE — Progress Notes (Signed)
Subjective:    Patient ID: Stefanie Orozco, female    DOB: 1960/02/12, 60 y.o.   MRN: [id number]  HPI  ROV 10/28/20 --60 year old woman with a history of presumed sarcoidosis.  She has associated mild obstructive lung disease.  She had an elevated ACE level and some basilar scar on chest imaging.  Also with allergic rhinitis.  Her most recent PFT were 07/27/2016.  Chest x-ray at 10/15/2019 reassuring. She is on CPAP, uses reliably.  Today she reports that she has been doing fairly well, deals with allergy sx when it is wet. Dry cough associated with that. Uses allegra. Good functional capacity, good exercise tolerance. She has to use albuterol more when allergies are active. Usually uses once a day. She is using Dulera only once a day.    ROV 12/09/20 --visit for 60 year old woman with presumed sarcoidosis based on an elevated ACE level, some CT chest abnormalities and mild obstructive lung disease.  She has OSA and is on CPAP.  Managed on Dulera qd.  At her last visit in September we planned to perform pulmonary function testing and a CT chest to better characterize her presumed sarcoid.  Also added Singulair to Allegra prn for chronic rhinitis. She had URI 3 weeks ago, still has some residual cough. She uses omeprazole prn.   High-resolution CT chest 11/10/2020 reviewed by me, shows no mediastinal or hilar lymphadenopathy, some patchy areas of cylindrical bronchiectatic change in peripheral bronchiolectasis and interstitial thickening, especially present in the upper zones.  No nodules or masses.  No clear interstitial disease or UIP pattern  Pulmonary function testing performed today and reviewed by me show normal airflows without a bronchodilator response, with some restriction on lung volumes.  Decreased diffusion capacity that corrects to normal range when adjusted for alveolar volume.  Her FEV1 is stable compared with 4 years ago.  Total lung capacity slightly improved compared with 4 years  ago.    Review of Systems As per HPI  Past Medical History:  Diagnosis Date   GERD (gastroesophageal reflux disease)    Hidradenitis    Pinched nerve in neck    Pulmonary sarcoidosis ([facility])    Sarcoid 1998   ACE> 400, compatible cxr     Family History  Problem Relation Age of Onset   Stroke Mother    Hypertension Mother    Hypertension Father      Social History   Socioeconomic History   Marital status: Married    Spouse name: Not on file   Number of children: Not on file   Years of education: Not on file   Highest education level: Not on file  Occupational History   Not on file  Tobacco Use   Smoking status: Former    Packs/day: 0.50    Types: Cigarettes    Quit date: 01/29/1985    Years since quitting: 35.8   Smokeless tobacco: Never  Vaping Use   Vaping Use: Never used  Substance and Sexual Activity   Alcohol use: No   Drug use: No   Sexual activity: Not on file  Other Topics Concern   Not on file  Social History Narrative   Not on file   Social Determinants of Health   Financial Resource Strain: Not on file  Food Insecurity: Not on file  Transportation Needs: Not on file  Physical Activity: Not on file  Stress: Not on file  Social Connections: Not on file  Intimate Partner Violence: Not on file  Allergies  Allergen Reactions   Codeine    Almond Oil Other (See Comments)     Outpatient Medications Prior to Visit  Medication Sig Dispense Refill   albuterol (VENTOLIN HFA) 108 (90 Base) MCG/ACT inhaler INHALE 1 TO 2 PUFFS BY MOUTH EVERY 4 TO 6 HOURS AS NEEDED 8.5 g 11   azithromycin (ZITHROMAX Z-PAK) 250 MG tablet Take 2 tabs today; then begin one tab once daily for 4 more days. 6 tablet 0   betamethasone dipropionate 0.05 % cream Apply topically daily. 45 g 3   celecoxib (CELEBREX) 200 MG capsule Take 200 mg by mouth daily as needed.     diclofenac (FLECTOR) 1.3 % PTCH Place 1 patch onto the skin daily.     DULERA 100-5 MCG/ACT AERO TAKE 2  PUFFS BY MOUTH TWICE A DAY (Patient taking differently: daily. TAKE 2 PUFFS BY MOUTH TWICE A DAY) 13 each 5   montelukast (SINGULAIR) 10 MG tablet Take 1 tablet (10 mg total) by mouth at bedtime. 90 tablet 3   Multiple Vitamin (MULTIVITAMIN WITH MINERALS) TABS tablet Take 1 tablet by mouth daily.     NON FORMULARY Lidiapincum- OTC herbal supplement for hot flashes 1 tab bid     omeprazole (PRILOSEC OTC) 20 MG tablet Take 20 mg by mouth daily.     omeprazole (PRILOSEC) 40 MG capsule Take 40 mg by mouth daily.     predniSONE (DELTASONE) 20 MG tablet Take one tab by mouth twice daily for 4 days, then one daily for 3 days. Take with food. 11 tablet 0   No facility-administered medications prior to visit.          Objective:   Physical Exam Vitals:   12/09/20 1213  BP: 128/86  Pulse: 80  Temp: 97.8 F (36.6 C)  TempSrc: Oral  SpO2: 98%  Weight: 158 lb 3.2 oz (71.8 kg)  Height: 5' 6.5" (1.689 m)   Gen: Pleasant, well-nourished, in no distress,  normal affect  ENT: No lesions,  mouth clear,  oropharynx clear, no postnasal drip  Neck: No JVD, no stridor  Lungs: No use of accessory muscles, clear bilaterally  Cardiovascular: RRR, heart sounds normal, no murmur or gallops, no peripheral edema  Musculoskeletal: No deformities, no cyanosis or clubbing  Neuro: alert, non focal  Skin: Warm, no lesions or rashes      Assessment & Plan:  SARCOIDOSIS, PULMONARY CT chest with some mild residual scar but no evidence of UIP or NSIP pattern.  No evidence of any new lymphadenopathy or nodular disease.  Reassuring.  Pulmonary function testing is also stable.  Plan to continue her [facility], she is using it once daily.  Minimal albuterol use.  We reviewed your pulmonary function testing and your CT chest today.  These are stable compared with priors.  Good news.  No evidence for active sarcoidosis. Please continue your Dulera 2 puffs once daily.  Rinse and gargle after using. Keep your  albuterol available to use 2 puffs when you needed for shortness of breath, chest tightness, wheezing. Follow with Dr. Lamonte Sakai in 12 months or sooner if you have any problems.   Chronic cough Flaring after recent URI, GERD and rhinitis sustaining. Will ramp up GERD and rhinitis rx temporarily  GERD Take your omeprazole 20 mg once daily every day for about a week.  Then you can go back to using it just as needed.  Allergic rhinitis Continue your Singulair 10 mg each evening Restart your Allegra once daily at  least through the fall allergy season.  Baltazar Apo, MD, PhD 12/09/2020, 12:33 PM [facility] [phone] or if no answer [phone]

## 2020-12-09 NOTE — Assessment & Plan Note (Signed)
CT chest with some mild residual scar but no evidence of UIP or NSIP pattern.  No evidence of any new lymphadenopathy or nodular disease.  Reassuring.  Pulmonary function testing is also stable.  Plan to continue her [facility], she is using it once daily.  Minimal albuterol use.  We reviewed your pulmonary function testing and your CT chest today.  These are stable compared with priors.  Good news.  No evidence for active sarcoidosis. Please continue your Dulera 2 puffs once daily.  Rinse and gargle after using. Keep your albuterol available to use 2 puffs when you needed for shortness of breath, chest tightness, wheezing. Follow with Dr. Lamonte Sakai in 12 months or sooner if you have any problems.

## 2020-12-09 NOTE — Assessment & Plan Note (Signed)
Flaring after recent URI, GERD and rhinitis sustaining. Will ramp up GERD and rhinitis rx temporarily

## 2020-12-09 NOTE — Assessment & Plan Note (Signed)
Continue your Singulair 10 mg each evening Restart your Allegra once daily at least through the fall allergy season.

## 2020-12-09 NOTE — Assessment & Plan Note (Signed)
Take your omeprazole 20 mg once daily every day for about a week.  Then you can go back to using it just as needed.

## 2021-01-14 ENCOUNTER — Other Ambulatory Visit: Payer: Self-pay

## 2021-01-14 ENCOUNTER — Emergency Department: Admit: 2021-01-14 | Payer: Self-pay

## 2021-01-14 ENCOUNTER — Emergency Department
Admission: EM | Admit: 2021-01-14 | Discharge: 2021-01-14 | Disposition: A | Discharge status: Home / Self Care | Payer: Managed Care, Other (non HMO) | Source: Home / Self Care

## 2021-01-14 DIAGNOSIS — J309 Allergic rhinitis, unspecified: Secondary | ICD-10-CM

## 2021-01-14 DIAGNOSIS — R059 Cough, unspecified: Secondary | ICD-10-CM | POA: Diagnosis not present

## 2021-01-14 DIAGNOSIS — J0100 Acute maxillary sinusitis, unspecified: Secondary | ICD-10-CM

## 2021-01-14 LAB — POCT RAPID STREP A (OFFICE): Rapid Strep A Screen: NEGATIVE

## 2021-01-14 LAB — POC SARS CORONAVIRUS 2 AG -  ED: SARS Coronavirus 2 Ag: NEGATIVE

## 2021-01-14 MED ORDER — PREDNISONE 20 MG PO TABS: ORAL_TABLET | ORAL | 0 refills | Status: DC

## 2021-01-14 MED ORDER — FEXOFENADINE HCL 180 MG PO TABS: 180.0000 mg | ORAL_TABLET | Freq: Every day | ORAL | 0 refills | Status: DC

## 2021-01-14 MED ORDER — CEFDINIR 300 MG PO CAPS: 300.0000 mg | ORAL_CAPSULE | Freq: Two times a day (BID) | ORAL | 0 refills | Status: AC

## 2021-01-14 NOTE — ED Provider Notes (Signed)
[facility]    CSN: [id number] Arrival date & time: 01/14/21  1134      History   Chief Complaint Chief Complaint  Patient presents with   Headache    Headache, sore throat, cough, chest congestion, chills and body aches. X2 days    HPI Stefanie Orozco is a 60 y.o. female.   HPI 60 year old female presents with headache, sore throat, cough, chest congestion chills, and body aches for 2 days.  Patient reports that she was exposed to COVID-19.  PMH significant for sarcoidosis and chronic cough.  Patient evaluated here on 11/11/2020 for viral URI and cough.  Was prescribed Zithromax and prednisone.  Patient's initial blood pressure was 176/105 at triage, second manual blood pressure taken roughly 25 minutes later was 140/90 left arm seated.  Past Medical History:  Diagnosis Date   GERD (gastroesophageal reflux disease)    Hidradenitis    Pinched nerve in neck    Pulmonary sarcoidosis ([place])    Sarcoid 1998   ACE> 400, compatible cxr    Patient Active Problem List   Diagnosis Date Noted   Chronic cough 12/09/2020   Allergic rhinitis 07/21/2018   Right arm pain 07/28/2013   SARCOIDOSIS, PULMONARY 02/20/2007   GERD 02/20/2007    Past Surgical History:  Procedure Laterality Date   ABLATION ON ENDOMETRIOSIS     BREAST SURGERY     LAPAROSCOPIC TUBAL LIGATION     laproscopic      OB History   No obstetric history on file.      Home Medications    Prior to Admission medications   Medication Sig Start Date End Date Taking? Authorizing Provider  albuterol (VENTOLIN HFA) 108 (90 Base) MCG/ACT inhaler INHALE 1 TO 2 PUFFS BY MOUTH EVERY 4 TO 6 HOURS AS NEEDED 02/26/20  Yes Byrum, Rose Fillers, MD  betamethasone dipropionate 0.05 % cream Apply topically daily. 10/02/19  Yes Sheffield, Kelli R, PA-C  celecoxib (CELEBREX) 200 MG capsule Take 200 mg by mouth daily as needed. 10/06/20  Yes [provider]  diclofenac (FLECTOR) 1.3 % PTCH Place 1 patch onto the skin  daily.   Yes [provider]  DULERA 100-5 MCG/ACT AERO TAKE 2 PUFFS BY MOUTH TWICE A DAY Patient taking differently: daily. TAKE 2 PUFFS BY MOUTH TWICE A DAY 02/29/20  Yes Byrum, Rose Fillers, MD  montelukast (SINGULAIR) 10 MG tablet Take 1 tablet (10 mg total) by mouth at bedtime. 11/03/20  Yes Collene Gobble, MD  Multiple Vitamin (MULTIVITAMIN WITH MINERALS) TABS tablet Take 1 tablet by mouth daily.   Yes [provider]  NON FORMULARY Lidiapincum- OTC herbal supplement for hot flashes 1 tab bid   Yes [provider]  omeprazole (PRILOSEC OTC) 20 MG tablet Take 20 mg by mouth daily. 06/28/20  Yes [provider]  omeprazole (PRILOSEC) 40 MG capsule Take 40 mg by mouth daily.   Yes [provider]  azithromycin (ZITHROMAX Z-PAK) 250 MG tablet Take 2 tabs today; then begin one tab once daily for 4 more days. 11/11/20   Kandra Nicolas, MD    Family History Family History  Problem Relation Age of Onset   Stroke Mother    Hypertension Mother    Hypertension Father     Social History Social History   Tobacco Use   Smoking status: Former    Packs/day: 0.50    Types: Cigarettes    Quit date: 01/29/1985    Years since quitting: 50.9  Smokeless tobacco: Never  Vaping Use   Vaping Use: Never used  Substance Use Topics   Alcohol use: No   Drug use: No     Allergies   Codeine and Almond oil   Review of Systems Review of Systems  Constitutional:  Positive for chills and fever.  Respiratory:  Positive for cough.   Musculoskeletal:  Positive for myalgias.  All other systems reviewed and are negative.   Physical Exam Triage Vital Signs ED Triage Vitals  Enc Vitals Group     BP 01/14/21 1324 (!) 176/105     Pulse Rate 01/14/21 1324 90     Resp 01/14/21 1324 20     Temp 01/14/21 1324 99.2 F (37.3 C)     Temp Source 01/14/21 1324 Oral     SpO2 01/14/21 1324 97 %     Weight 01/14/21 1317 160 lb (72.6 kg)     Height 01/14/21 1317 5'  7" (1.702 m)     Head Circumference --      Peak Flow --      Pain Score 01/14/21 1317 8     Pain Loc --      Pain Edu? --      Excl. in [place]? --    No data found.  Updated Vital Signs BP (!) 176/105 (BP Location: Left Arm)    Pulse 90    Temp 99.2 F (37.3 C) (Oral)    Resp 20    Ht 5\' 7"  (1.702 m)    Wt 160 lb (72.6 kg)    SpO2 97%    BMI 25.06 kg/m   Physical Exam Vitals and nursing note reviewed.  Constitutional:      Appearance: Normal appearance. She is normal weight.  HENT:     Head: Normocephalic and atraumatic.     Right Ear: Tympanic membrane, ear canal and external ear normal.     Left Ear: Tympanic membrane, ear canal and external ear normal.     Mouth/Throat:     Mouth: Mucous membranes are dry.     Pharynx: Oropharynx is clear.  Eyes:     Extraocular Movements: Extraocular movements intact.     Conjunctiva/sclera: Conjunctivae normal.     Pupils: Pupils are equal, round, and reactive to light.  Cardiovascular:     Rate and Rhythm: Normal rate and regular rhythm.     Pulses: Normal pulses.     Heart sounds: Normal heart sounds.  Pulmonary:     Effort: Pulmonary effort is normal.     Breath sounds: Normal breath sounds.  Musculoskeletal:        General: Normal range of motion.     Cervical back: Normal range of motion and neck supple.  Neurological:     General: No focal deficit present.     Mental Status: She is alert and oriented to person, place, and time.     UC Treatments / Results  Labs (all labs ordered are listed, but only abnormal results are displayed) Labs Reviewed - No data to display  EKG   Radiology No results found.  Procedures Procedures (including critical care time)  Medications Ordered in UC Medications - No data to display  Initial Impression / Assessment and Plan / UC Course  I have reviewed the triage vital signs and the nursing notes.  Pertinent labs & imaging results that were available during my care of the patient  were reviewed by me and considered in my medical decision making (see  chart for details).     MDM: 1.  Cough-Rx'd Prednisone; 2.  Allergic rhinitis-Rx'd Allegra; 3.  Subacute maxillary sinusitis-Rx'd Cefdinir. Advised patient COVID-19 and strep were negative.  Advised patient to take medication (prednisone and Allegra together for the next 5 days) as directed with food to completion.  Encourage patient to increase daily water intake while taking these medications.  Advised patient after 5 days if symptoms have not improved may start Cefdinir.  Once starting Cefdinir take to completion.  Patient discharged home, hemodynamically stable. Final Clinical Impressions(s) / UC Diagnoses   Final diagnoses:  None   Discharge Instructions   None    ED Prescriptions   None    PDMP not reviewed this encounter.   Eliezer Lofts, [place] 01/14/21 1433

## 2021-01-14 NOTE — ED Triage Notes (Signed)
Pt states that she has a headache, sore throat, cough, chest congestion, chills and body aches. X2 days  Pt states that she was exposed to covid.   Pt states that she is vaccinated for covid.  Pt states that she has had flu vaccine.

## 2021-01-14 NOTE — Discharge Instructions (Addendum)
Advised patient COVID-19 and strep were negative.  Advised patient to take medication (prednisone and Allegra together for the next 5 days) as directed with food to completion.  Encourage patient to increase daily water intake while taking these medications.  Advised patient after 5 days if symptoms have not improved may start Cefdinir.  Once starting Cefdinir take to completion.

## 2021-02-16 ENCOUNTER — Other Ambulatory Visit: Payer: Self-pay | Admitting: *Deleted

## 2021-02-16 MED ORDER — MONTELUKAST SODIUM 10 MG PO TABS: 10.0000 mg | ORAL_TABLET | Freq: Every day | ORAL | 3 refills | Status: DC

## 2021-03-25 ENCOUNTER — Other Ambulatory Visit: Payer: Self-pay | Admitting: Emergency Medicine

## 2021-03-29 ENCOUNTER — Telehealth: Payer: Self-pay | Admitting: Emergency Medicine

## 2021-03-29 MED ORDER — DULERA 100-5 MCG/ACT IN AERO: 2.0000 | INHALATION_SPRAY | Freq: Every day | RESPIRATORY_TRACT | 5 refills | Status: DC

## 2021-03-29 NOTE — Telephone Encounter (Signed)
Rx for pt's [facility] inhaler has been sent to preferred pharmacy for pt. Called and spoke with pt letting her know this was done and she verbalized understanding. Nothing further needed. ?

## 2021-04-27 ENCOUNTER — Other Ambulatory Visit: Payer: Self-pay | Admitting: Emergency Medicine

## 2021-05-24 ENCOUNTER — Ambulatory Visit: Payer: Managed Care, Other (non HMO) | Admitting: Physician Assistant

## 2021-05-24 ENCOUNTER — Encounter: Payer: Self-pay | Admitting: Physician Assistant

## 2021-05-24 DIAGNOSIS — D2271 Melanocytic nevi of right lower limb, including hip: Secondary | ICD-10-CM | POA: Diagnosis not present

## 2021-05-24 DIAGNOSIS — D229 Melanocytic nevi, unspecified: Secondary | ICD-10-CM

## 2021-05-24 DIAGNOSIS — L910 Hypertrophic scar: Secondary | ICD-10-CM | POA: Diagnosis not present

## 2021-05-24 MED ORDER — TRETINOIN 0.05 % EX CREA: TOPICAL_CREAM | Freq: Every day | CUTANEOUS | 3 refills | Status: AC

## 2021-05-24 NOTE — Progress Notes (Signed)
? ?  Follow-Up Visit ?  ?Subjective  ?Stefanie Orozco is a 61 y.o. female who presents for the following: Skin Problem (Patient here today for lesion on her hands and arms that come up and go away x years. Patient states that she has a lesion on her right pinky toe x years that's growing, no bleeding, no pain. Patient states that she has a lesion also that's sensitivity on her right lower leg x year. Patient also wants to know what other treatment she can try for keloids on her breast per patient the beta dip cream isn't helping. ). ? ? ?The following portions of the chart were reviewed this encounter and updated as appropriate:  Tobacco  Allergies  Meds  Problems  Med Hx  Surg Hx  Fam Hx   ?  ? ?Objective  ?Well appearing patient in no apparent distress; mood and affect are within normal limits. ? ?A full examination was performed including scalp, head, eyes, ears, nose, lips, neck, chest, axillae, abdomen, back, buttocks, bilateral upper extremities, bilateral lower extremities, hands, feet, fingers, toes, fingernails, and toenails. All findings within normal limits unless otherwise noted below. ? ?Right 5th Proximal Interphalangeal Joint of Toe ?Lattice like patterned brown macule ? ? ? ? ?Left Breast, Right Breast ?Thick pink scarred in plaques.  ? ? ?Assessment & Plan  ?Nevus ?Right 5th Proximal Interphalangeal Joint of Toe ? ?Observe. Return to clinic if recurs. ? ?Keloid (2) ?Left Breast; Right Breast ? ?tretinoin (RETIN-A) 0.05 % cream - Left Breast, Right Breast ?Apply topically at bedtime. ? ?Related Medications ?betamethasone dipropionate 0.05 % cream ?Apply topically daily. ? ? ? ?I, Jamison Yuhasz, PA-C, have reviewed all documentation's for this visit.  The documentation on 05/24/21 for the exam, diagnosis, procedures and orders are all accurate and complete. ?

## 2021-05-24 NOTE — Patient Instructions (Signed)
[facility] Dr. Renaldo Reel  ?

## 2021-08-27 IMAGING — DX DG SACRUM/COCCYX 2+V
3 series · 3 of 3 positions shown · non-contrast
Comparison: None.

CLINICAL DATA: Back pain.  Tailbone pain.

EXAM:
SACRUM AND COCCYX - 2+ VIEW

[coccyx ap]
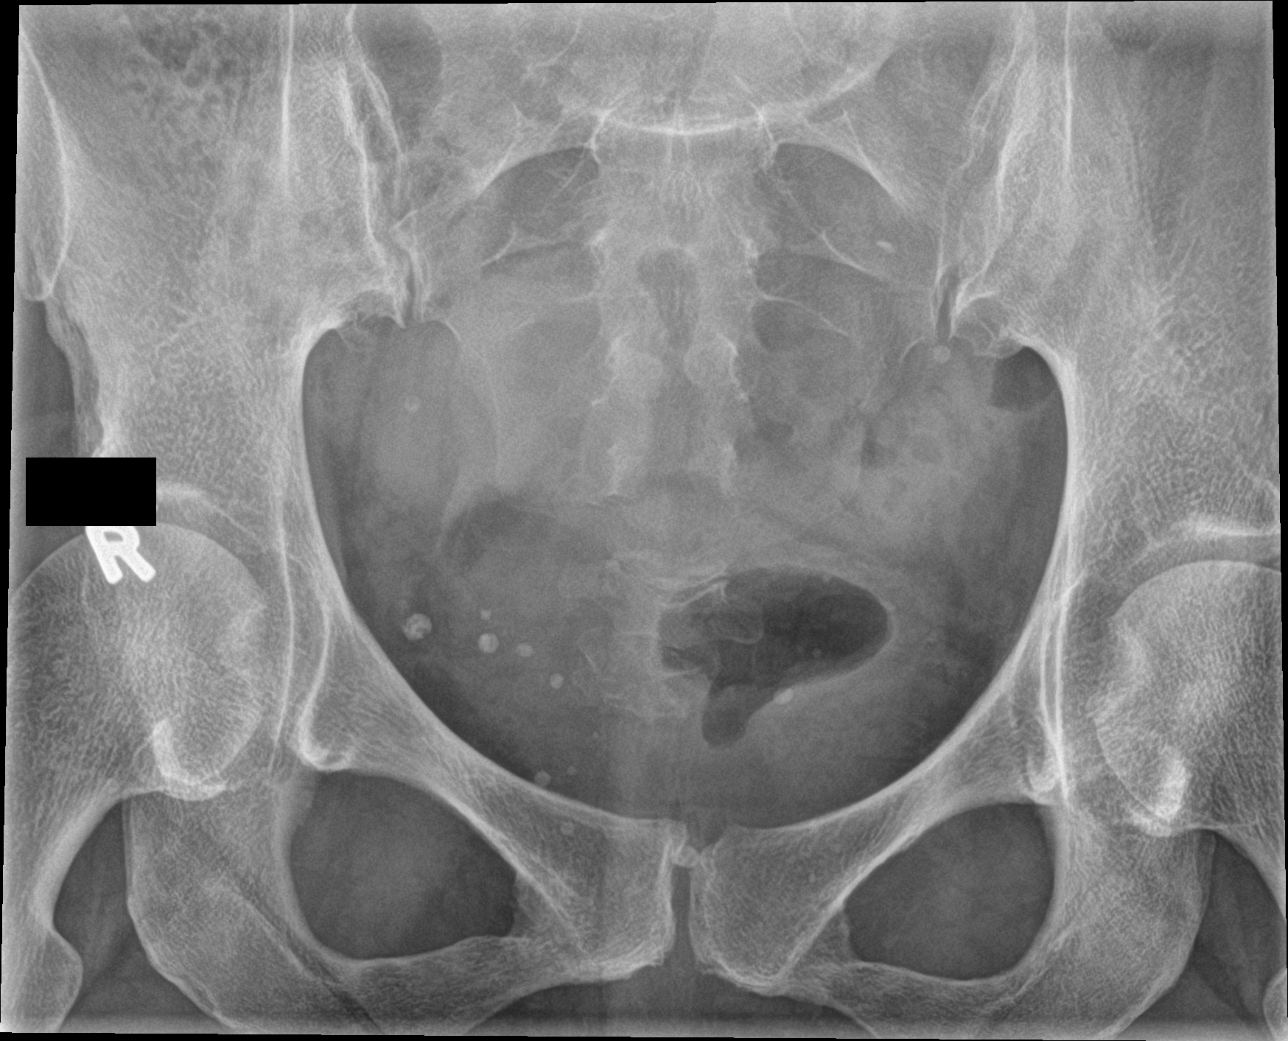

[sacrum ap]
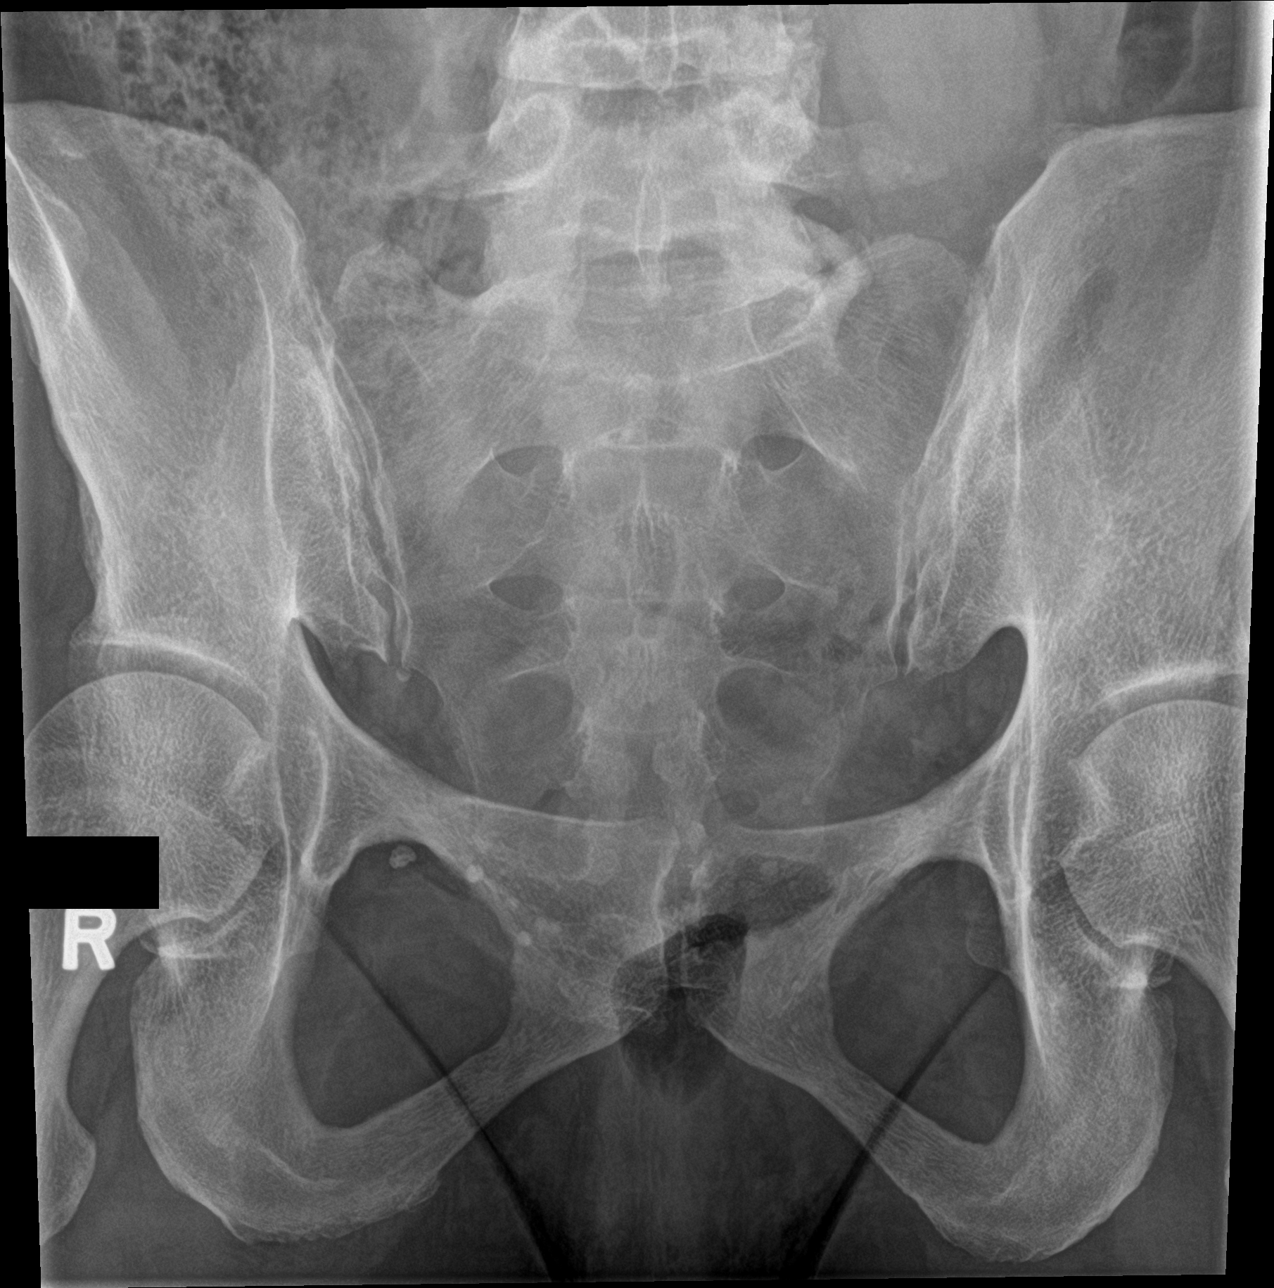

[sacrum lat]
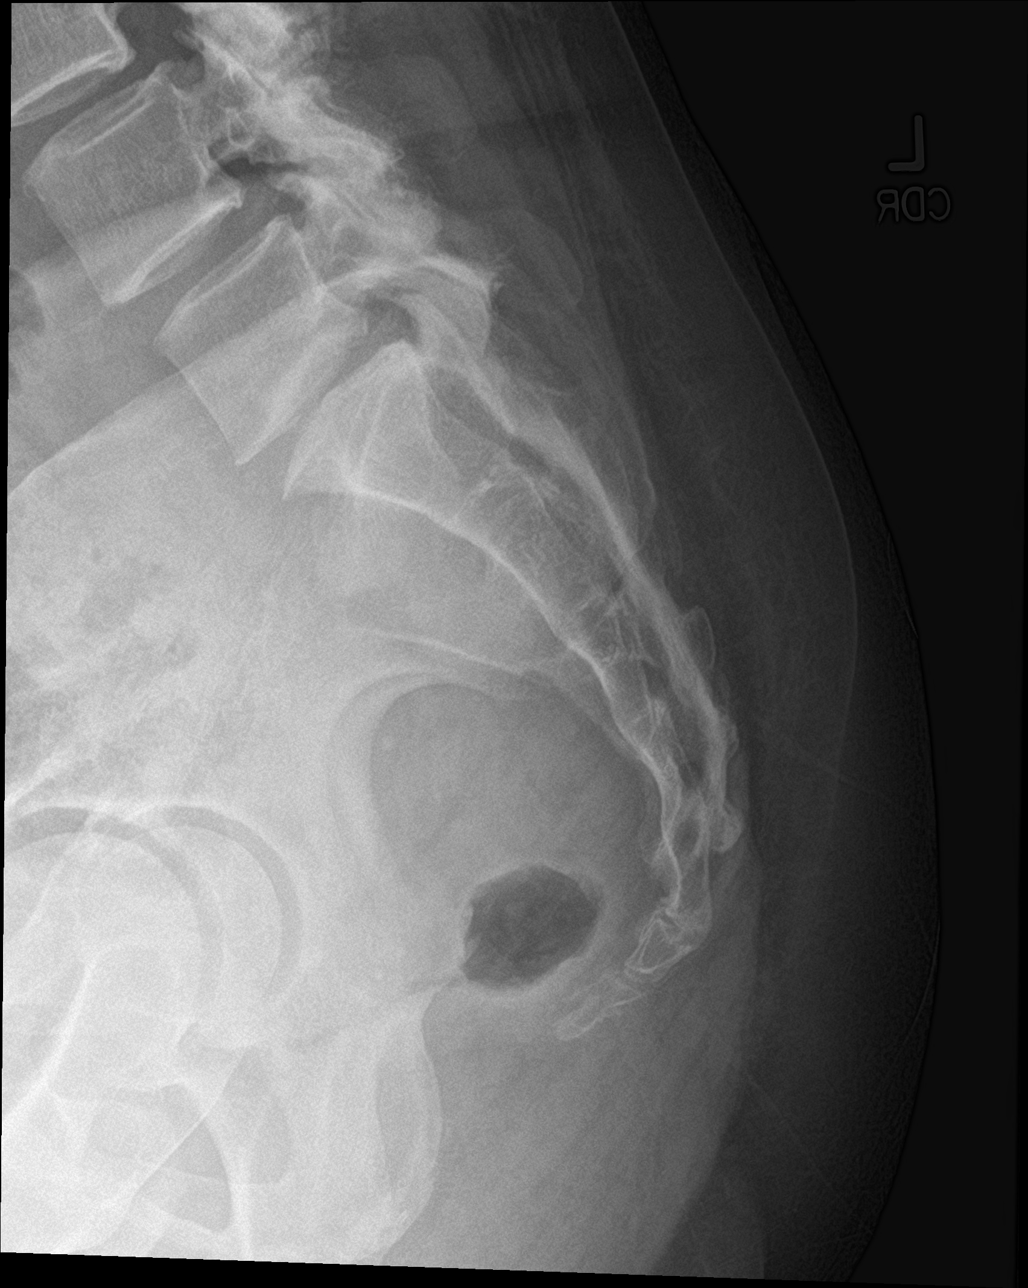

[3 of 3 positions shown; findings below may reference images not displayed]

FINDINGS: There is no evidence of fracture or other focal bone lesions.
IMPRESSION: Negative.

## 2021-11-17 ENCOUNTER — Telehealth: Payer: Self-pay | Admitting: Emergency Medicine

## 2021-11-17 MED ORDER — MONTELUKAST SODIUM 10 MG PO TABS: 10.0000 mg | ORAL_TABLET | Freq: Every day | ORAL | 3 refills | Status: DC

## 2021-11-17 NOTE — Telephone Encounter (Signed)
Rx for pt's montelukast has been sent to pharmacy for pt. Called and spoke with pt letting her know this had been done and she verbalized understanding. Nothing further needed.

## 2021-12-13 ENCOUNTER — Ambulatory Visit: Payer: 59 | Admitting: Emergency Medicine

## 2021-12-13 ENCOUNTER — Encounter: Payer: Self-pay | Admitting: Emergency Medicine

## 2021-12-13 VITALS — BP 120/80 | HR 91 | Temp 98.1°F | Ht 67.0 in | Wt 163.6 lb

## 2021-12-13 DIAGNOSIS — D869 Sarcoidosis, unspecified: Secondary | ICD-10-CM | POA: Diagnosis not present

## 2021-12-13 DIAGNOSIS — Z23 Encounter for immunization: Secondary | ICD-10-CM | POA: Diagnosis not present

## 2021-12-13 DIAGNOSIS — J301 Allergic rhinitis due to pollen: Secondary | ICD-10-CM | POA: Diagnosis not present

## 2021-12-13 NOTE — Assessment & Plan Note (Addendum)
With associated obstructive lung disease.  Overall she has been doing well.  She had 1 flare in December that was associated with COVID and had to be treated with corticosteroids.  Good functional capacity.  Her allergic and overall symptoms have been improved since she stopped all months to which she has been found to be allergic  We will plan to continue Dulera 2 puffs once daily.  Rinse and gargle after using. Keep your albuterol available to use 2 puffs when you needed for shortness of breath, chest tightness, wheezing. We will determine the timing of repeat pulmonary function testing and CT scan of the chest at your next visit Flu shot today Follow with Dr. Lamonte Sakai in 12 months or sooner if you have any problems.

## 2021-12-13 NOTE — Patient Instructions (Signed)
We will plan to continue Dulera 2 puffs once daily.  Rinse and gargle after using. Keep your albuterol available to use 2 puffs when you needed for shortness of breath, chest tightness, wheezing. We will determine the timing of repeat pulmonary function testing and CT scan of the chest at your next visit Flu shot today Continue Singulair 10 mg each evening Continue Allegra once daily Follow with Dr. Lamonte Sakai in 12 months or sooner if you have any problems.

## 2021-12-13 NOTE — Assessment & Plan Note (Signed)
Plan to continue Singulair, Allegra.  Better control since she stopped almonds to which she is allergic.

## 2021-12-13 NOTE — Progress Notes (Signed)
Subjective:    Patient ID: Stefanie Orozco, female    DOB: 10-23-1960, 62 y.o.   MRN: [id number]  HPI  ROV 12/09/20 --visit for 61 year old woman with presumed sarcoidosis based on an elevated ACE level, some CT chest abnormalities and mild obstructive lung disease.  She has OSA and is on CPAP.  Managed on Dulera qd.  At her last visit in September we planned to perform pulmonary function testing and a CT chest to better characterize her presumed sarcoid.  Also added Singulair to Allegra prn for chronic rhinitis. She had URI 3 weeks ago, still has some residual cough. She uses omeprazole prn.   High-resolution CT chest 11/10/2020 reviewed by me, shows no mediastinal or hilar lymphadenopathy, some patchy areas of cylindrical bronchiectatic change in peripheral bronchiolectasis and interstitial thickening, especially present in the upper zones.  No nodules or masses.  No clear interstitial disease or UIP pattern  Pulmonary function testing performed today and reviewed by me show normal airflows without a bronchodilator response, with some restriction on lung volumes.  Decreased diffusion capacity that corrects to normal range when adjusted for alveolar volume.  Her FEV1 is stable compared with 4 years ago.  Total lung capacity slightly improved compared with 4 years ago.  ROV 12/13/21 --61 year old woman with a history of presumed sarcoidosis based on some scattered parenchymal interstitial disease on CT chest (not in a UIP or NSIP pattern), elevated ACE level, mild obstructive disease.  She also has OSA and is on CPAP. 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 Since she stopped these she has had less rhinitis sx.  We have been managing her on Dulera currently using only once a day, Singulair, Allegra. She uses albuterol most mornings but does not require otherwise. No flares since last December when she had COVID. She gets her eye exam regularly. No Rash.     Review of  Systems As per HPI  Past Medical History:  Diagnosis Date   GERD (gastroesophageal reflux disease)    Hidradenitis    Pinched nerve in neck    Pulmonary sarcoidosis ([facility])    Sarcoid 1998   ACE> 400, compatible cxr     Family History  Problem Relation Age of Onset   Stroke Mother    Hypertension Mother    Hypertension Father      Social History   Socioeconomic History   Marital status: Married    Spouse name: Not on file   Number of children: Not on file   Years of education: Not on file   Highest education level: Not on file  Occupational History   Not on file  Tobacco Use   Smoking status: Former    Packs/day: 0.50    Types: Cigarettes    Quit date: 01/29/1985    Years since quitting: 36.8   Smokeless tobacco: Never  Vaping Use   Vaping Use: Never used  Substance and Sexual Activity   Alcohol use: No   Drug use: No   Sexual activity: Not on file  Other Topics Concern   Not on file  Social History Narrative   Not on file   Social Determinants of Health   Financial Resource Strain: Not on file  Food Insecurity: Not on file  Transportation Needs: Not on file  Physical Activity: Not on file  Stress: Not on file  Social Connections: Not on file  Intimate Partner Violence: Not on file  Allergies  Allergen Reactions   Codeine    Almond Oil Other (See Comments)     Outpatient Medications Prior to Visit  Medication Sig Dispense Refill   albuterol (VENTOLIN HFA) 108 (90 Base) MCG/ACT inhaler INHALE 1 TO 2 PUFFS BY MOUTH EVERY 4 TO 6 HOURS AS NEEDED 8.5 each 10   betamethasone dipropionate 0.05 % cream Apply topically daily. 45 g 3   celecoxib (CELEBREX) 200 MG capsule Take 200 mg by mouth daily as needed.     diclofenac (FLECTOR) 1.3 % PTCH Place 1 patch onto the skin daily.     mometasone-formoterol (DULERA) 100-5 MCG/ACT AERO Inhale 2 puffs into the lungs daily. TAKE 2 PUFFS BY MOUTH TWICE A DAY 13 g 5   montelukast (SINGULAIR) 10 MG tablet Take 1  tablet (10 mg total) by mouth at bedtime. 90 tablet 3   Multiple Vitamin (MULTIVITAMIN WITH MINERALS) TABS tablet Take 1 tablet by mouth daily.     NON FORMULARY Lidiapincum- OTC herbal supplement for hot flashes 1 tab bid     omeprazole (PRILOSEC) 40 MG capsule Take 40 mg by mouth.     tretinoin (RETIN-A) 0.05 % cream Apply topically at bedtime. 45 g 3   fexofenadine (ALLEGRA ALLERGY) 180 MG tablet Take 1 tablet (180 mg total) by mouth daily for 15 days. 15 tablet 0   azithromycin (ZITHROMAX Z-PAK) 250 MG tablet Take 2 tabs today; then begin one tab once daily for 4 more days. 6 tablet 0   omeprazole (PRILOSEC OTC) 20 MG tablet Take 20 mg by mouth daily.     omeprazole (PRILOSEC) 40 MG capsule Take 40 mg by mouth daily. (Patient not taking: Reported on 12/13/2021)     predniSONE (DELTASONE) 20 MG tablet Take 3 tabs PO daily x 5 days. 15 tablet 0   No facility-administered medications prior to visit.          Objective:   Physical Exam Vitals:   12/13/21 1548  BP: 120/80  Pulse: 91  Temp: 98.1 F (36.7 C)  TempSrc: Oral  SpO2: 98%  Weight: 163 lb 9.6 oz (74.2 kg)  Height: '5\' 7"'$  (1.702 m)   Gen: Pleasant, well-nourished, in no distress,  normal affect  ENT: No lesions,  mouth clear,  oropharynx clear, no postnasal drip  Neck: No JVD, no stridor  Lungs: No use of accessory muscles, clear bilaterally  Cardiovascular: RRR, heart sounds normal, no murmur or gallops, no peripheral edema  Musculoskeletal: No deformities, no cyanosis or clubbing  Neuro: alert, non focal  Skin: Warm, no lesions or rashes      Assessment & Plan:  SARCOIDOSIS, PULMONARY With associated obstructive lung disease.  Overall she has been doing well.  She had 1 flare in December that was associated with COVID and had to be treated with corticosteroids.  Good functional capacity.  Her allergic and overall symptoms have been improved since she stopped all months to which she has been found to be  allergic  We will plan to continue Dulera 2 puffs once daily.  Rinse and gargle after using. Keep your albuterol available to use 2 puffs when you needed for shortness of breath, chest tightness, wheezing. We will determine the timing of repeat pulmonary function testing and CT scan of the chest at your next visit Flu shot today Follow with Dr. Lamonte Sakai in 12 months or sooner if you have any problems.   Allergic rhinitis Plan to continue Singulair, Allegra.  Better control since she stopped  almonds to which she is allergic.  Baltazar Apo, MD, PhD 12/13/2021, 4:02 PM [facility] [phone] or if no answer [phone]

## 2021-12-19 ENCOUNTER — Other Ambulatory Visit (HOSPITAL_COMMUNITY): Payer: Self-pay

## 2021-12-19 ENCOUNTER — Telehealth: Payer: Self-pay | Admitting: Emergency Medicine

## 2021-12-19 NOTE — Telephone Encounter (Signed)
We know what LABA/ICS is covered on her formulary. Unclear why there would be a coverage change before 01/2022 unless she lost insurance or changed companies

## 2021-12-19 NOTE — Telephone Encounter (Signed)
Called and spoke to patient and she states her insurance will no longer cover her [facility] and she is completely out of medication.. Can we run a ticket to see what is covered.  Dr Lamonte Sakai, Since patient is out of medication do you want her to have any of the samples that we have in clinic. We have Trelegy and Breztri.  Please advise

## 2021-12-20 ENCOUNTER — Other Ambulatory Visit (HOSPITAL_COMMUNITY): Payer: Self-pay

## 2021-12-20 MED ORDER — BUDESONIDE-FORMOTEROL FUMARATE 160-4.5 MCG/ACT IN AERO: 2.0000 | INHALATION_SPRAY | Freq: Two times a day (BID) | RESPIRATORY_TRACT | 3 refills | Status: DC

## 2021-12-20 NOTE — Telephone Encounter (Signed)
I would prefer a change to Symbicort 160, 90 day supply

## 2021-12-20 NOTE — Telephone Encounter (Signed)
Per benefits investigation into alternatives for ICS/LABA it seems like the following are covered: Breo, Brand Advair Diskus, Symbicort and Advair HFA all at a current co-pay of $40.00 per 1 month. Also notification that a 90 day supply may be covered at a cheaper rate.

## 2021-12-20 NOTE — Telephone Encounter (Signed)
ATC LMTCB 12/20/2021 @ 11:09 AM

## 2021-12-20 NOTE — Telephone Encounter (Signed)
Called patient and she states that she had an insurance change in September and lost her coverage on her [facility] then. But now she is out of medication. So per our pharmacy team they ran a ticket to see what is covered and it is noted below:  Per benefits investigation into alternatives for ICS/LABA it seems like the following are covered: Breo, Brand Advair Diskus, Symbicort and Advair HFA all at a current co-pay of $40.00 per 1 month. Also notification that a 90 day supply may be covered at a cheaper rate.   Please advise sir

## 2021-12-20 NOTE — Telephone Encounter (Signed)
Patient would like the nurse to call regarding the alternative medications.  She stated that she has some questions. Please call patient to discuss at [phone]

## 2021-12-20 NOTE — Telephone Encounter (Signed)
Called and spoke with pt letting her know the info per RB and she verbalized understanding. Rx for Symbicort has been sent to pharmacy for pt. Nothing further needed.

## 2021-12-26 ENCOUNTER — Ambulatory Visit
Admission: EM | Admit: 2021-12-26 | Discharge: 2021-12-26 | Disposition: A | Discharge status: Home / Self Care | Payer: 59 | Attending: Family Medicine | Admitting: Family Medicine

## 2021-12-26 DIAGNOSIS — R509 Fever, unspecified: Secondary | ICD-10-CM | POA: Insufficient documentation

## 2021-12-26 DIAGNOSIS — B9689 Other specified bacterial agents as the cause of diseases classified elsewhere: Secondary | ICD-10-CM | POA: Diagnosis not present

## 2021-12-26 DIAGNOSIS — K219 Gastro-esophageal reflux disease without esophagitis: Secondary | ICD-10-CM | POA: Insufficient documentation

## 2021-12-26 DIAGNOSIS — D860 Sarcoidosis of lung: Secondary | ICD-10-CM | POA: Diagnosis not present

## 2021-12-26 DIAGNOSIS — Z1152 Encounter for screening for COVID-19: Secondary | ICD-10-CM | POA: Diagnosis not present

## 2021-12-26 DIAGNOSIS — J0100 Acute maxillary sinusitis, unspecified: Secondary | ICD-10-CM | POA: Insufficient documentation

## 2021-12-26 DIAGNOSIS — R059 Cough, unspecified: Secondary | ICD-10-CM | POA: Insufficient documentation

## 2021-12-26 LAB — RESP PANEL BY RT-PCR (FLU A&B, COVID) ARPGX2
Influenza A by PCR: NEGATIVE
Influenza B by PCR: NEGATIVE
SARS Coronavirus 2 by RT PCR: NEGATIVE

## 2021-12-26 MED ORDER — CEFDINIR 300 MG PO CAPS: 300.0000 mg | ORAL_CAPSULE | Freq: Two times a day (BID) | ORAL | 0 refills | Status: AC

## 2021-12-26 MED ORDER — PREDNISONE 20 MG PO TABS: ORAL_TABLET | ORAL | 0 refills | Status: DC

## 2021-12-26 NOTE — Discharge Instructions (Addendum)
Advised patient to take medications as directed with food to completion.  Advised patient to take prednisone with first dose of cefdinir for the next 5 of 7 days.  Encourage patient to increase daily water intake to 64 ounces per day while taking these medications.  Advised we will follow-up with COVID-19/influenza results once received.  Advised if symptoms worsen and/or unresolved please follow-up with PCP or here for further evaluation.

## 2021-12-26 NOTE — ED Provider Notes (Signed)
[name]    CSN: [id number] Arrival date & time: 12/26/21  0911      History   Chief Complaint Chief Complaint  Patient presents with   Cough   Nasal Congestion    HPI Stefanie Orozco is a 61 y.o. female.   HPI 61 year old female presents with cough and congestion for 1 week worsening over the past 2 days.  Patient reports history of sinus infections.  PMH significant for pulmonary sarcoidosis, chronic cough, and GERD.  Past Medical History:  Diagnosis Date   GERD (gastroesophageal reflux disease)    Hidradenitis    Pinched nerve in neck    Pulmonary sarcoidosis ([place])    Sarcoid 1998   ACE> 400, compatible cxr    Patient Active Problem List   Diagnosis Date Noted   Chronic cough 12/09/2020   Allergic rhinitis 07/21/2018   Right arm pain 07/28/2013   SARCOIDOSIS, PULMONARY 02/20/2007   GERD 02/20/2007    Past Surgical History:  Procedure Laterality Date   ABLATION ON ENDOMETRIOSIS     BREAST SURGERY     LAPAROSCOPIC TUBAL LIGATION     laproscopic      OB History   No obstetric history on file.      Home Medications    Prior to Admission medications   Medication Sig Start Date End Date Taking? Authorizing Provider  cefdinir (OMNICEF) 300 MG capsule Take 1 capsule (300 mg total) by mouth 2 (two) times daily for 7 days. 12/26/21 01/02/22 Yes Eliezer Lofts, FNP  predniSONE (DELTASONE) 20 MG tablet Take 3 tabs PO daily x 5 days. 12/26/21  Yes Eliezer Lofts, FNP  albuterol (VENTOLIN HFA) 108 (90 Base) MCG/ACT inhaler INHALE 1 TO 2 PUFFS BY MOUTH EVERY 4 TO 6 HOURS AS NEEDED 04/27/21   Collene Gobble, MD  betamethasone dipropionate 0.05 % cream Apply topically daily. 10/02/19   Sheffield, Ronalee Red, PA-C  budesonide-formoterol (SYMBICORT) 160-4.5 MCG/ACT inhaler Inhale 2 puffs into the lungs in the morning and at bedtime. 12/20/21   Collene Gobble, MD  celecoxib (CELEBREX) 200 MG capsule Take 200 mg by mouth daily as needed. 10/06/20   [provider]  diclofenac (FLECTOR) 1.3 % PTCH Place 1 patch onto the skin daily.    [provider]  fexofenadine (ALLEGRA ALLERGY) 180 MG tablet Take 1 tablet (180 mg total) by mouth daily for 15 days. 01/14/21 01/29/21  Eliezer Lofts, FNP  montelukast (SINGULAIR) 10 MG tablet Take 1 tablet (10 mg total) by mouth at bedtime. 11/17/21   Collene Gobble, MD  Multiple Vitamin (MULTIVITAMIN WITH MINERALS) TABS tablet Take 1 tablet by mouth daily.    [provider]  NON FORMULARY Lidiapincum- OTC herbal supplement for hot flashes 1 tab bid    [provider]  omeprazole (PRILOSEC) 40 MG capsule Take 40 mg by mouth. 09/11/21   [provider]  tretinoin (RETIN-A) 0.05 % cream Apply topically at bedtime. 05/24/21 05/24/22  Warren Danes, PA-C    Family History Family History  Problem Relation Age of Onset   Stroke Mother    Hypertension Mother    Hypertension Father     Social History Social History   Tobacco Use   Smoking status: Former    Packs/day: 0.50    Types: Cigarettes    Quit date: 01/29/1985    Years since quitting: 36.9   Smokeless tobacco: Never  Vaping Use   Vaping Use: Never used  Substance Use Topics  Alcohol use: No   Drug use: No     Allergies   Codeine and Almond oil   Review of Systems Review of Systems  HENT:  Positive for congestion, postnasal drip and sinus pressure.   All other systems reviewed and are negative.    Physical Exam Triage Vital Signs ED Triage Vitals  Enc Vitals Group     BP 12/26/21 0943 (!) 141/91     Pulse Rate 12/26/21 0943 86     Resp 12/26/21 0943 17     Temp 12/26/21 0943 98.2 F (36.8 C)     Temp Source 12/26/21 0943 Oral     SpO2 12/26/21 0943 99 %     Weight --      Height --      Head Circumference --      Peak Flow --      Pain Score 12/26/21 0944 0     Pain Loc --      Pain Edu? --      Excl. in [place]? --    No data found.  Updated Vital Signs BP (!) 141/91 (BP  Location: Right Arm)   Pulse 86   Temp 98.2 F (36.8 C) (Oral)   Resp 17   SpO2 99%       Physical Exam Vitals and nursing note reviewed.  Constitutional:      Appearance: Normal appearance. She is normal weight. She is not ill-appearing.  HENT:     Head: Normocephalic and atraumatic.     Right Ear: Tympanic membrane and external ear normal.     Left Ear: Tympanic membrane and external ear normal.     Ears:     Comments: Moderate eustachian tube dysfunction noted bilaterally    Mouth/Throat:     Mouth: Mucous membranes are moist.     Pharynx: Oropharynx is clear.  Eyes:     Extraocular Movements: Extraocular movements intact.     Conjunctiva/sclera: Conjunctivae normal.     Pupils: Pupils are equal, round, and reactive to light.  Cardiovascular:     Rate and Rhythm: Normal rate and regular rhythm.     Pulses: Normal pulses.     Heart sounds: Normal heart sounds.  Pulmonary:     Effort: Pulmonary effort is normal.     Breath sounds: No wheezing, rhonchi or rales.  Musculoskeletal:        General: Normal range of motion.     Cervical back: Normal range of motion and neck supple.  Skin:    General: Skin is warm and dry.  Neurological:     General: No focal deficit present.     Mental Status: She is alert and oriented to person, place, and time. Mental status is at baseline.      UC Treatments / Results  Labs (all labs ordered are listed, but only abnormal results are displayed) Labs Reviewed  RESP PANEL BY RT-PCR (FLU A&B, COVID) [id number]    EKG   Radiology No results found.  Procedures Procedures (including critical care time)  Medications Ordered in UC Medications - No data to display  Initial Impression / Assessment and Plan / UC Course  I have reviewed the triage vital signs and the nursing notes.  Pertinent labs & imaging results that were available during my care of the patient were reviewed by me and considered in my medical decision making (see  chart for details).     MDM: 1.  Acute maxillary sinusitis, recurrence not specified-Rx'd  cefdinir, prednisone; 2.  Fever-lab 10093 ordered. Advised patient to take medications as directed with food to completion.  Advised patient to take prednisone with first dose of cefdinir for the next 5 of 7 days.  Encouraged patient to increase daily water intake to 64 ounces per day while taking these medications.  Advised we will follow-up with COVID-19/influenza results once received.  Advised if symptoms worsen and/or unresolved please follow-up with PCP or here for further evaluation. Final Clinical Impressions(s) / UC Diagnoses   Final diagnoses:  Acute maxillary sinusitis, recurrence not specified  Fever, unspecified     Discharge Instructions      Advised patient to take medications as directed with food to completion.  Advised patient to take prednisone with first dose of cefdinir for the next 5 of 7 days.  Encourage patient to increase daily water intake to 64 ounces per day while taking these medications.  Advised we will follow-up with COVID-19/influenza results once received.  Advised if symptoms worsen and/or unresolved please follow-up with PCP or here for further evaluation.     ED Prescriptions     Medication Sig Dispense Auth. Provider   cefdinir (OMNICEF) 300 MG capsule Take 1 capsule (300 mg total) by mouth 2 (two) times daily for 7 days. 14 capsule Eliezer Lofts, FNP   predniSONE (DELTASONE) 20 MG tablet Take 3 tabs PO daily x 5 days. 15 tablet Eliezer Lofts, FNP      PDMP not reviewed this encounter.   Eliezer Lofts, [place] 12/26/21 1117

## 2021-12-26 NOTE — ED Triage Notes (Signed)
Pt c/o cough and congestion x 1 week. Worsening in last couple days. Denies fever. Taking Alkaseltzer and saline rinse prn. Hx of sinus infections.

## 2022-01-07 ENCOUNTER — Encounter: Payer: Self-pay | Admitting: Emergency Medicine

## 2022-01-08 MED ORDER — PREDNISONE 10 MG PO TABS: 10.0000 mg | ORAL_TABLET | Freq: Every day | ORAL | 0 refills | Status: DC

## 2022-01-08 NOTE — Telephone Encounter (Signed)
Dr. Byrum, please see mychart message sent by pt and advise. 

## 2022-01-08 NOTE — Telephone Encounter (Signed)
I believe it would be helpful to take a more extended course prednisone. If she is not responding then we will need to get her seen in office.   Prednisone > Take '40mg'$  daily for 3 days, then '30mg'$  daily for 3 days, then '20mg'$  daily for 3 days, then '10mg'$  daily for 3 days, then stop

## 2022-02-08 ENCOUNTER — Telehealth: Payer: Self-pay | Admitting: Emergency Medicine

## 2022-02-08 NOTE — Telephone Encounter (Signed)
Please advise, no order for CT

## 2022-02-09 NOTE — Telephone Encounter (Signed)
Pt called back after not hearing from office.  Pt is wanting to know if she could come to the office to get a chest xray performed, not to have a CT performed.  Pt said she is still sick coughing and wants to have a chest xray performed to see if it shows anything.   Dr. Lamonte Sakai, please advise if you are okay with pt coming to our office to get an xray performed.

## 2022-02-10 NOTE — Telephone Encounter (Signed)
Please have her come in and get the chest x-ray Also offer her an acute visit with APP if she is having new symptoms.  They can review the chest x-ray and decide whether any treatment is needed

## 2022-02-12 ENCOUNTER — Ambulatory Visit (INDEPENDENT_AMBULATORY_CARE_PROVIDER_SITE_OTHER): Payer: 59

## 2022-02-12 ENCOUNTER — Telehealth: Payer: Self-pay

## 2022-02-12 ENCOUNTER — Other Ambulatory Visit: Payer: Self-pay

## 2022-02-12 DIAGNOSIS — R053 Chronic cough: Secondary | ICD-10-CM

## 2022-02-12 NOTE — Telephone Encounter (Signed)
I spoke with patient. I placed an order for a chest xray she will come in this afternoon to complete. No app had an opening this week. Dr. Valeta Harms had a spot at 4pm Thursday I was able to schedule her with him.  Dr. Lamonte Sakai just an fyi and can you please sign the order for the cxr

## 2022-02-12 NOTE — Telephone Encounter (Signed)
Called patient but she did not answer. Left message for her to call back.  

## 2022-02-12 NOTE — Telephone Encounter (Signed)
Thank you :)

## 2022-02-15 ENCOUNTER — Ambulatory Visit: Payer: 59 | Admitting: Pulmonary Disease

## 2022-02-15 ENCOUNTER — Encounter: Payer: Self-pay | Admitting: Pulmonary Disease

## 2022-02-15 VITALS — BP 120/90 | HR 85 | Ht 67.0 in | Wt 162.8 lb

## 2022-02-15 DIAGNOSIS — J111 Influenza due to unidentified influenza virus with other respiratory manifestations: Secondary | ICD-10-CM

## 2022-02-15 DIAGNOSIS — R053 Chronic cough: Secondary | ICD-10-CM

## 2022-02-15 DIAGNOSIS — D869 Sarcoidosis, unspecified: Secondary | ICD-10-CM | POA: Diagnosis not present

## 2022-02-15 MED ORDER — BREZTRI AEROSPHERE 160-9-4.8 MCG/ACT IN AERO: 2.0000 | INHALATION_SPRAY | Freq: Two times a day (BID) | RESPIRATORY_TRACT | 1 refills | Status: AC

## 2022-02-15 MED ORDER — PREDNISONE 10 MG PO TABS: ORAL_TABLET | ORAL | 0 refills | Status: DC

## 2022-02-15 NOTE — Patient Instructions (Signed)
Thank you for visiting Dr. Valeta Harms at [facility]. Today we recommend the following:  Meds ordered this encounter  Medications   predniSONE (DELTASONE) 10 MG tablet    Sig: Take 4 tabs by mouth once daily x4 days, then 3 tabs x4 days, 2 tabs x4 days, 1 tab x4 days and stop.    Dispense:  40 tablet    Refill:  0   Budeson-Glycopyrrol-Formoterol (BREZTRI AEROSPHERE) 160-9-4.8 MCG/ACT AERO    Sig: Inhale 2 puffs into the lungs in the morning and at bedtime.    Dispense:  3 each    Refill:  1   Return in about 8 weeks (around 04/12/2022) for Dr. Lamonte Sakai .    Please do your part to reduce the spread of COVID-19.

## 2022-02-15 NOTE — Progress Notes (Addendum)
Synopsis: Referred in Jan 2024 for cough by Willey Blade, MD  Subjective:   PATIENT ID: Stefanie Orozco: female DOB: [birth year], MRN: [id number]  Chief Complaint  Patient presents with   Acute Visit    Cough, yellowish green mucus    This is a 62 year old female here today for an acute visit complaining of cough shortness of breath sputum production.  Has been followed by Dr. Lamonte Sakai for pulmonary sarcoidosis.  Has been on Dulera then transition to Symbicort.  She tested positive for influenza last week.  She is been recovering from that she feels better but she still having ongoing cough and sputum production.  She has been on and off steroids several times and respiratory illnesses this year.  She was started on Breztri inhaler by her primary care provider and likes using that better than she does the Symbicort.  She feels like she breathes better with it.    Past Medical History:  Diagnosis Date   GERD (gastroesophageal reflux disease)    Hidradenitis    Pinched nerve in neck    Pulmonary sarcoidosis ([facility])    Sarcoid 1998   ACE> 400, compatible cxr     Family History  Problem Relation Age of Onset   Stroke Mother    Hypertension Mother    Hypertension Father      Past Surgical History:  Procedure Laterality Date   ABLATION ON ENDOMETRIOSIS     BREAST SURGERY     LAPAROSCOPIC TUBAL LIGATION     laproscopic      Social History   Socioeconomic History   Marital status: Married    Spouse name: Not on file   Number of children: Not on file   Years of education: Not on file   Highest education level: Not on file  Occupational History   Not on file  Tobacco Use   Smoking status: Former    Packs/day: 0.50    Types: Cigarettes    Quit date: 01/29/1985    Years since quitting: 37.0   Smokeless tobacco: Never  Vaping Use   Vaping Use: Never used  Substance and Sexual Activity   Alcohol use: No   Drug use: No   Sexual activity: Not on file  Other Topics  Concern   Not on file  Social History Narrative   Not on file   Social Determinants of Health   Financial Resource Strain: Not on file  Food Insecurity: Not on file  Transportation Needs: Not on file  Physical Activity: Not on file  Stress: Not on file  Social Connections: Not on file  Intimate Partner Violence: Not on file     Allergies  Allergen Reactions   Codeine    Almond Oil Other (See Comments)     Outpatient Medications Prior to Visit  Medication Sig Dispense Refill   albuterol (VENTOLIN HFA) 108 (90 Base) MCG/ACT inhaler INHALE 1 TO 2 PUFFS BY MOUTH EVERY 4 TO 6 HOURS AS NEEDED 8.5 each 10   diclofenac (FLECTOR) 1.3 % PTCH Place 1 patch onto the skin daily.     montelukast (SINGULAIR) 10 MG tablet Take 1 tablet (10 mg total) by mouth at bedtime. 90 tablet 3   betamethasone dipropionate 0.05 % cream Apply topically daily. 45 g 3   budesonide-formoterol (SYMBICORT) 160-4.5 MCG/ACT inhaler Inhale 2 puffs into the lungs in the morning and at bedtime. (Patient not taking: Reported on 02/15/2022) 30.6 g 3   celecoxib (CELEBREX) 200 MG capsule Take  200 mg by mouth daily as needed.     fexofenadine (ALLEGRA ALLERGY) 180 MG tablet Take 1 tablet (180 mg total) by mouth daily for 15 days. 15 tablet 0   Multiple Vitamin (MULTIVITAMIN WITH MINERALS) TABS tablet Take 1 tablet by mouth daily.     NON FORMULARY Lidiapincum- OTC herbal supplement for hot flashes 1 tab bid     omeprazole (PRILOSEC) 40 MG capsule Take 40 mg by mouth.     tretinoin (RETIN-A) 0.05 % cream Apply topically at bedtime. 45 g 3   predniSONE (DELTASONE) 10 MG tablet Take 1 tablet (10 mg total) by mouth daily with breakfast. '40mg'$  daily for 3 days, then '30mg'$  daily for 3 days, then '20mg'$  daily for 3 days, then '10mg'$  daily for 3 days, then stop 30 tablet 0   predniSONE (DELTASONE) 20 MG tablet Take 3 tabs PO daily x 5 days. 15 tablet 0   No facility-administered medications prior to visit.    Review of Systems   Constitutional:  Negative for chills, fever, malaise/fatigue and weight loss.  HENT:  Negative for hearing loss, sore throat and tinnitus.   Eyes:  Negative for blurred vision and double vision.  Respiratory:  Positive for cough, sputum production and shortness of breath. Negative for hemoptysis, wheezing and stridor.   Cardiovascular:  Negative for chest pain, palpitations, orthopnea, leg swelling and PND.  Gastrointestinal:  Negative for abdominal pain, constipation, diarrhea, heartburn, nausea and vomiting.  Genitourinary:  Negative for dysuria, hematuria and urgency.  Musculoskeletal:  Negative for joint pain and myalgias.  Skin:  Negative for itching and rash.  Neurological:  Negative for dizziness, tingling, weakness and headaches.  Endo/Heme/Allergies:  Negative for environmental allergies. Does not bruise/bleed easily.  Psychiatric/Behavioral:  Negative for depression. The patient is not nervous/anxious and does not have insomnia.   All other systems reviewed and are negative.    Objective:  Physical Exam Vitals reviewed.  Constitutional:      General: She is not in acute distress.    Appearance: She is well-developed.  HENT:     Head: Normocephalic and atraumatic.  Eyes:     General: No scleral icterus.    Conjunctiva/sclera: Conjunctivae normal.     Pupils: Pupils are equal, round, and reactive to light.  Neck:     Vascular: No JVD.     Trachea: No tracheal deviation.  Cardiovascular:     Rate and Rhythm: Normal rate and regular rhythm.     Heart sounds: Normal heart sounds. No murmur heard. Pulmonary:     Effort: Pulmonary effort is normal. No tachypnea, accessory muscle usage or respiratory distress.     Breath sounds: No stridor. No wheezing, rhonchi or rales.  Abdominal:     General: There is no distension.     Palpations: Abdomen is soft.     Tenderness: There is no abdominal tenderness.  Musculoskeletal:        General: No tenderness.     Cervical back:  Neck supple.  Lymphadenopathy:     Cervical: No cervical adenopathy.  Skin:    General: Skin is warm and dry.     Capillary Refill: Capillary refill takes less than 2 seconds.     Findings: No rash.  Neurological:     Mental Status: She is alert and oriented to person, place, and time.  Psychiatric:        Behavior: Behavior normal.      Vitals:   02/15/22 1616  BP: (!) 120/90  Pulse: 85  SpO2: 97%  Weight: 162 lb 12.8 oz (73.8 kg)  Height: '5\' 7"'$  (1.702 m)   97% on RA BMI Readings from Last 3 Encounters:  02/15/22 25.50 kg/m  12/13/21 25.62 kg/m  01/14/21 25.06 kg/m   Wt Readings from Last 3 Encounters:  02/15/22 162 lb 12.8 oz (73.8 kg)  12/13/21 163 lb 9.6 oz (74.2 kg)  01/14/21 160 lb (72.6 kg)     CBC    Component Value Date/Time   WBC 4.1 09/12/2009 0907   RBC 3.78 (L) 09/12/2009 0907   HGB 13.7 11/14/2009 0700   HCT 38.3 09/12/2009 0907   PLT 283 09/12/2009 0907   MCV 101.5 (H) 09/12/2009 0907   MCH 33.9 09/12/2009 0907   MCHC 33.4 09/12/2009 0907   RDW 13.1 09/12/2009 0907    Chest Imaging: Chest x-ray 02/12/2022: No infiltrate. The patient's images have been independently reviewed by me.    Pulmonary Functions Testing Results:    Latest Ref Rng & Units 12/09/2020   10:54 AM 07/27/2016    1:42 PM 08/12/2014    2:48 PM  PFT Results  FVC-Pre L 2.88  2.97  3.16   FVC-Predicted Pre % 97  97  104   FVC-Post L 2.91  2.99  3.07   FVC-Predicted Post % 98  97  101   Pre FEV1/FVC % % 83  82  80   Post FEV1/FCV % % 81  82  82   FEV1-Pre L 2.39  2.43  2.53   FEV1-Predicted Pre % 102  100  105   FEV1-Post L 2.37  2.44  2.51   DLCO uncorrected ml/min/mmHg 14.93  16.19  18.29   DLCO UNC% % 67  58  67   DLCO corrected ml/min/mmHg 14.93  16.39    DLCO COR %Predicted % 67  59    DLVA Predicted % 96  83  90   TLC L 4.18  4.09  4.64   TLC % Predicted % 76  75  86   RV % Predicted % 64  52  59     FeNO:   Pathology:   Echocardiogram:   Heart  Catheterization:     Assessment & Plan:     ICD-10-CM   1. SARCOIDOSIS, PULMONARY  D86.9     2. Chronic cough  R05.3     3. Influenza  J11.1       Discussion:  This is a 62 year old female, history of pulmonary sarcoidosis chronic cough.  Recently diagnosed with influenza last week.  Having increased cough sputum production.  Ongoing exacerbation with history of known obstructive disease related to her sarcoid.  Plan: Prednisone taper sent to pharmacy New prescription for [facility] inhaler as she likes using this after being given a sample from her PCP. She would like this sent into her mail-in order pharmacy. Return to clinic in 6 to 8 weeks to see Dr. Lamonte Sakai for follow-up.    Current Outpatient Medications:    albuterol (VENTOLIN HFA) 108 (90 Base) MCG/ACT inhaler, INHALE 1 TO 2 PUFFS BY MOUTH EVERY 4 TO 6 HOURS AS NEEDED, Disp: 8.5 each, Rfl: 10   Budeson-Glycopyrrol-Formoterol (BREZTRI AEROSPHERE) 160-9-4.8 MCG/ACT AERO, Inhale 2 puffs into the lungs in the morning and at bedtime., Disp: 3 each, Rfl: 1   diclofenac (FLECTOR) 1.3 % PTCH, Place 1 patch onto the skin daily., Disp: , Rfl:    montelukast (SINGULAIR) 10 MG tablet, Take 1 tablet (10 mg total) by mouth  at bedtime., Disp: 90 tablet, Rfl: 3   predniSONE (DELTASONE) 10 MG tablet, Take 4 tabs by mouth once daily x4 days, then 3 tabs x4 days, 2 tabs x4 days, 1 tab x4 days and stop., Disp: 40 tablet, Rfl: 0   betamethasone dipropionate 0.05 % cream, Apply topically daily., Disp: 45 g, Rfl: 3   budesonide-formoterol (SYMBICORT) 160-4.5 MCG/ACT inhaler, Inhale 2 puffs into the lungs in the morning and at bedtime. (Patient not taking: Reported on 02/15/2022), Disp: 30.6 g, Rfl: 3   celecoxib (CELEBREX) 200 MG capsule, Take 200 mg by mouth daily as needed., Disp: , Rfl:    fexofenadine (ALLEGRA ALLERGY) 180 MG tablet, Take 1 tablet (180 mg total) by mouth daily for 15 days., Disp: 15 tablet, Rfl: 0   Multiple Vitamin (MULTIVITAMIN  WITH MINERALS) TABS tablet, Take 1 tablet by mouth daily., Disp: , Rfl:    NON FORMULARY, Lidiapincum- OTC herbal supplement for hot flashes 1 tab bid, Disp: , Rfl:    omeprazole (PRILOSEC) 40 MG capsule, Take 40 mg by mouth., Disp: , Rfl:    tretinoin (RETIN-A) 0.05 % cream, Apply topically at bedtime., Disp: 45 g, Rfl: 3   Garner Nash, DO [facility] 02/15/2022 4:33 PM

## 2022-04-25 ENCOUNTER — Other Ambulatory Visit: Payer: Self-pay | Admitting: Emergency Medicine

## 2022-05-16 NOTE — Telephone Encounter (Signed)
Pt has been seen since calling the office. Closing encounter.

## 2022-05-27 ENCOUNTER — Encounter: Payer: Self-pay | Admitting: Emergency Medicine

## 2022-05-29 ENCOUNTER — Ambulatory Visit: Payer: Managed Care, Other (non HMO) | Admitting: Physician Assistant

## 2022-08-13 ENCOUNTER — Encounter: Payer: Self-pay | Admitting: Emergency Medicine

## 2022-08-15 NOTE — Telephone Encounter (Signed)
Yes - completely agree with doing this

## 2022-08-16 NOTE — Telephone Encounter (Signed)
Patient checking on prior authorization for [facility]. Patient phone number is [phone].

## 2022-08-17 ENCOUNTER — Other Ambulatory Visit: Payer: Self-pay | Admitting: Emergency Medicine

## 2022-08-17 MED ORDER — MOMETASONE FURO-FORMOTEROL FUM 100-5 MCG/ACT IN AERO: 2.0000 | INHALATION_SPRAY | Freq: Two times a day (BID) | RESPIRATORY_TRACT | 3 refills | Status: DC

## 2022-08-17 NOTE — Telephone Encounter (Signed)
Routing to PA team to submit PA for [facility]  Thanks so much!

## 2022-08-21 NOTE — Telephone Encounter (Signed)
PA team: PT "a little disappointed" with [place] because this all goes back to 7/16- 7/17. Any word yet on her PA for [facility]?  Her # is [phone]

## 2022-08-22 ENCOUNTER — Telehealth: Payer: Self-pay | Admitting: *Deleted

## 2022-08-22 NOTE — Telephone Encounter (Signed)
Patient would like this request for PA for her [facility] escalated.  This PA should have been requested on 7/17.  Thank you.

## 2022-08-22 NOTE — Telephone Encounter (Signed)
Can we follow up on PA for [facility]

## 2022-08-22 NOTE — Telephone Encounter (Signed)
Please start PA for [facility].  Thank you.

## 2022-08-24 ENCOUNTER — Telehealth: Payer: Self-pay

## 2022-08-24 ENCOUNTER — Other Ambulatory Visit (HOSPITAL_COMMUNITY): Payer: Self-pay

## 2022-08-24 NOTE — Telephone Encounter (Signed)
PT has been calling since 7/15 for a pre-auth on her Dulera.I have reached out to the following staff for assistance:  Archie Patten Pre-auth Team  I am concerned for this PT since she is very elevated at this point and I do not know what else I can do. Please call to advise on progress. Her # is [phone]

## 2022-08-24 NOTE — Telephone Encounter (Signed)
Is there anything I can do to facilitate this PA?

## 2022-08-24 NOTE — Telephone Encounter (Signed)
PA has been submitted as an expedited request and is pending determination. Will be updated in additional encounter created.

## 2022-08-24 NOTE — Telephone Encounter (Signed)
*  Asthma/Allergy  Pharmacy Patient Advocate Encounter   Received notification from Pt Calls Messages that prior authorization for Dulera 100-5MCG/ACT aerosol  is required/requested.   Insurance verification completed.   The patient is insured through [facility] .   Per test claim: PA required; PA submitted to [facility] via CoverMyMeds Key/confirmation #/EOC [id number] Status is pending

## 2022-08-28 NOTE — Telephone Encounter (Signed)
Pharmacy Patient Advocate Encounter  Received notification from [facility] that Prior Authorization for [facility] 100-5MCG/ACT aerosol  has been DENIED. Please advise how you'd like to proceed. Full denial letter will be uploaded to the media tab. See denial reason below.  PA #/Case ID/Reference #: [id number]  Denial reason: The request for coverage for DULERA AER 100-5MCG, use as directed (13 per month), is denied. This decision is based on health plan criteria for DULERA AER 100-5MCG. This medicine is covered only if: You have failed or cannot use all of the following: (1) Breo Ellipta. (2) Fluticasone/salmeterol (generic AirDuo Respiclick). The information provided does not show that you meet the criteria listed above.

## 2022-08-28 NOTE — Telephone Encounter (Signed)
PA has been DENIED due to:   The request for coverage for DULERA AER 100-5MCG, use as directed (13 per month), is denied. This decision is based on health plan criteria for DULERA AER 100-5MCG. This medicine is covered only if: You have failed or cannot use all of the following: (1) Breo Ellipta. (2) Fluticasone/salmeterol (generic AirDuo Respiclick). The information provided does not show that you meet the criteria listed above.

## 2022-08-29 ENCOUNTER — Ambulatory Visit (INDEPENDENT_AMBULATORY_CARE_PROVIDER_SITE_OTHER): Payer: 59 | Admitting: Emergency Medicine

## 2022-08-29 ENCOUNTER — Encounter: Payer: Self-pay | Admitting: Emergency Medicine

## 2022-08-29 VITALS — BP 130/82 | HR 80 | Temp 98.1°F | Ht 67.0 in | Wt 166.4 lb

## 2022-08-29 DIAGNOSIS — D869 Sarcoidosis, unspecified: Secondary | ICD-10-CM | POA: Diagnosis not present

## 2022-08-29 DIAGNOSIS — R053 Chronic cough: Secondary | ICD-10-CM | POA: Diagnosis not present

## 2022-08-29 MED ORDER — PREDNISONE 10 MG PO TABS: 10.0000 mg | ORAL_TABLET | Freq: Every day | ORAL | 0 refills | Status: DC

## 2022-08-29 MED ORDER — FLUTICASONE FUROATE-VILANTEROL 100-25 MCG/ACT IN AEPB: 1.0000 | INHALATION_SPRAY | Freq: Every day | RESPIRATORY_TRACT | 11 refills | Status: DC

## 2022-08-29 NOTE — Addendum Note (Signed)
Addended by: Christen Butter on: 08/29/2022 04:24 PM   Modules accepted: Orders

## 2022-08-29 NOTE — Assessment & Plan Note (Signed)
Progressive symptoms, clearly worse over at least 6 months time.  She has been off of Dulera due to insurance reasons, tells me that she has not had it for over a year.  She is using frequent albuterol.  She had an elevated ACE level at Dr. Mathews Robinsons office although I do not have that value in front of me.  I think she needs to be treated for acute flare of sarcoidosis and then transition back to scheduled BD therapy.  Her insurance requires that she fail AirDuo and Breo before she can be back on [place].  I will start the [facility] now.  We will repeat her CT scan of the chest to compare with priors, I suspect there will be evidence of flaring sarcoid present.

## 2022-08-29 NOTE — Assessment & Plan Note (Signed)
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  Cough is one of her biggest symptoms.  Need to continue to treat rhinitis, GERD.

## 2022-08-29 NOTE — Progress Notes (Signed)
Subjective:    Patient ID: Stefanie Orozco, female    DOB: 1961-01-04, 62 y.o.   MRN: [id number]  HPI  Pulmonary function testing performed today and reviewed by me show normal airflows without a bronchodilator response, with some restriction on lung volumes.  Decreased diffusion capacity that corrects to normal range when adjusted for alveolar volume.  Her FEV1 is stable compared with 4 years ago.  Total lung capacity slightly improved compared with 4 years ago.  ROV 12/13/21 --62 year old woman with a history of presumed sarcoidosis based on some scattered parenchymal interstitial disease on CT chest (not in a UIP or NSIP pattern), elevated ACE level, mild obstructive disease.  She also has OSA and is on CPAP. 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 Since she stopped these she has had less rhinitis sx.  We have been managing her on Dulera currently using only once a day, Singulair, Allegra. She uses albuterol most mornings but does not require otherwise. No flares since last December when she had COVID. She gets her eye exam regularly. No Rash.    ROV 08/29/2022 --follow-up visit for 62 year old woman with history of presumed sarcoidosis based on elevated ACE level and scattered parenchymal interstitial infiltrates on CT scan of the chest.  She has associated mild obstructive lung disease, OSA on CPAP, upper airway irritation syndrome with rhinitis.  She used to be on [facility] but is having a difficult time getting this due to insurance, we tried a prior authorization but she has to fail AirDuo or Breo first. She hasn't been on her [place] Sleight for over a year. She is having persistent cough, especially at night. Her exercise tolerance has declined. Using albuterol more frequently. Her ACE Level w Dr Bernette Mayers was significantly elevated at her last OV - I don't have that result.    She is on Allegra, Singulair, Prilosec 40 mg daily    Review of Systems As per HPI  Past  Medical History:  Diagnosis Date   GERD (gastroesophageal reflux disease)    Hidradenitis    Pinched nerve in neck    Pulmonary sarcoidosis (HCC)    Sarcoid 1998   ACE> 400, compatible cxr     Family History  Problem Relation Age of Onset   Stroke Mother    Hypertension Mother    Hypertension Father      Social History   Socioeconomic History   Marital status: Married    Spouse name: Not on file   Number of children: Not on file   Years of education: Not on file   Highest education level: Not on file  Occupational History   Not on file  Tobacco Use   Smoking status: Former    Current packs/day: 0.00    Types: Cigarettes    Quit date: 01/29/1985    Years since quitting: 37.6   Smokeless tobacco: Never  Vaping Use   Vaping status: Never Used  Substance and Sexual Activity   Alcohol use: No   Drug use: No   Sexual activity: Not on file  Other Topics Concern   Not on file  Social History Narrative   Not on file   Social Determinants of Health   Financial Resource Strain: Low Risk  (03/11/2022)   Received from [facility], [facility]   Overall Financial Resource Strain (CARDIA)    Difficulty of Paying Living Expenses: Not hard at all  Food Insecurity: No Food Insecurity (03/11/2022)  Received from [facility], [facility]   Hunger Vital Sign    Worried About Running Out of Food in the Last Year: Never true    Ran Out of Food in the Last Year: Never true  Transportation Needs: No Transportation Needs (03/11/2022)   Received from [facility], [facility]   [facility] - Transportation    Lack of Transportation (Medical): No    Lack of Transportation (Non-Medical): No  Physical Activity: Insufficiently Active (03/11/2022)   Received from [facility], [facility]   Exercise Vital Sign    Days of Exercise per Week: 2 days    Minutes of Exercise per Session: 30 min  Stress: No Stress Concern Present (03/11/2022)   Received from [facility], [facility] - Occupational Stress Questionnaire    Feeling of Stress : Only a little  Social Connections: Moderately Integrated (03/11/2022)   Received from [facility], [facility]   Social Network    How would you rate your social network (family, work, friends)?: Adequate participation with social networks  Intimate Partner Violence: Not At Risk (03/11/2022)   Received from [facility], [facility]   HITS    Over the last 12 months how often did your partner physically hurt you?: 1    Over the last 12 months how often did your partner insult you or talk down to you?: 1    Over the last 12 months how often did your partner threaten you with physical harm?: 1    Over the last 12 months how often did your partner scream or curse at you?: 2     Allergies  Allergen Reactions   Codeine    Almond Oil Other (See Comments)    Anything with ALMONDS     Outpatient Medications Prior to Visit  Medication Sig Dispense Refill   albuterol (VENTOLIN HFA) 108 (90 Base) MCG/ACT inhaler INHALE 1 TO 2 PUFFS BY MOUTH EVERY 4 TO 6 HOURS AS NEEDED 8.5 each 10   budesonide-formoterol (SYMBICORT) 160-4.5 MCG/ACT inhaler Inhale 2 puffs into the lungs in the morning and at bedtime. 30.6 g 3   diclofenac (FLECTOR) 1.3 % PTCH Place 1 patch onto the skin daily.     mometasone-formoterol (DULERA) 100-5 MCG/ACT AERO Inhale 2 puffs into the lungs in the morning and at bedtime. 3 each 3   montelukast (SINGULAIR) 10 MG tablet Take 1 tablet (10 mg total) by mouth at bedtime. 90 tablet 3   Multiple Vitamin (MULTIVITAMIN WITH MINERALS) TABS tablet Take 1 tablet by mouth daily.     NON FORMULARY Lidiapincum- OTC herbal supplement for hot flashes 1 tab bid     omeprazole (PRILOSEC) 40 MG capsule Take 40 mg by mouth.     fexofenadine (ALLEGRA ALLERGY) 180 MG tablet Take 1 tablet (180 mg total) by mouth daily for 15 days. 15 tablet 0   betamethasone dipropionate 0.05 % cream  Apply topically daily. 45 g 3   celecoxib (CELEBREX) 200 MG capsule Take 200 mg by mouth daily as needed.     predniSONE (DELTASONE) 10 MG tablet Take 4 tabs by mouth once daily x4 days, then 3 tabs x4 days, 2 tabs x4 days, 1 tab x4 days and stop. 40 tablet 0   No facility-administered medications prior to visit.          Objective:   Physical Exam Vitals:   08/29/22 1545  BP: 130/82  Pulse: 80  Temp: 98.1 F (  36.7 C)  SpO2: 99%  Weight: 166 lb 6.4 oz (75.5 kg)  Height: 5\' 7"  (1.702 m)   Gen: Pleasant, well-nourished, in no distress,  normal affect  ENT: No lesions,  mouth clear,  oropharynx clear, hoarse voice  Neck: No JVD, no stridor  Lungs: No use of accessory muscles, clear bilaterally  Cardiovascular: RRR, heart sounds normal, no murmur or gallops, no peripheral edema  Musculoskeletal: No deformities, no cyanosis or clubbing  Neuro: alert, non focal  Skin: Warm, no lesions or rashes      Assessment & Plan:  SARCOIDOSIS, PULMONARY Progressive symptoms, clearly worse over at least 6 months time.  She has been off of Dulera due to insurance reasons, tells me that she has not had it for over a year.  She is using frequent albuterol.  She had an elevated ACE level at Dr. Mathews Robinsons office although I do not have that value in front of me.  I think she needs to be treated for acute flare of sarcoidosis and then transition back to scheduled BD therapy.  Her insurance requires that she fail AirDuo and Breo before she can be back on [place].  I will start the [facility] now.  We will repeat her CT scan of the chest to compare with priors, I suspect there will be evidence of flaring sarcoid present.  Chronic cough Overlap and overlay with her sarcoid and obstructive lung disease.  Cough is one of her biggest symptoms.  Need to continue to treat rhinitis, GERD.   Levy Pupa, MD, PhD 08/29/2022, 4:19 PM [facility] [phone] or if no answer [phone]

## 2022-08-29 NOTE — Patient Instructions (Signed)
Please take prednisone 30 mg daily for 3 weeks, then decrease to 20 mg for 5 days, then decrease to 10 mg for 5 days, then stop. We will arrange for a repeat CT scan of the chest without contrast to evaluate sarcoidosis We will start Breo 100, 1 puff once daily.  Rinse and gargle after using. Keep your albuterol available use 2 puffs if needed for shortness of breath, chest tightness, wheezing. Please continue Singulair, Allegra, Prilosec as you have been taking them Follow with Dr. Delton Coombes in 6 weeks or next available opening to review your symptoms and your CT scan of the chest.

## 2022-09-03 ENCOUNTER — Encounter: Payer: Self-pay | Admitting: Emergency Medicine

## 2022-09-04 NOTE — Telephone Encounter (Signed)
Dr. Delton Coombes,  The PA for the [facility] was denied, below is the explanation from the PA team:  PA has been DENIED due to:    The request for coverage for DULERA AER 100-5MCG, use as directed (13 per month), is denied. This decision is based on health plan criteria for DULERA AER 100-5MCG. This medicine is covered only if: You have failed or cannot use all of the following: (1) Breo Ellipta. (2) Fluticasone/salmeterol (generic AirDuo Respiclick). The information provided does not show that you meet the criteria listed above.

## 2022-09-04 NOTE — Telephone Encounter (Signed)
Patient has to check with pharmacy benefits through [employer]. [employer] is patients medical coverage. See previous encounter for additional information regarding denial.

## 2022-09-11 ENCOUNTER — Ambulatory Visit (HOSPITAL_BASED_OUTPATIENT_CLINIC_OR_DEPARTMENT_OTHER)
Admission: RE | Admit: 2022-09-11 | Discharge: 2022-09-11 | Disposition: A | Discharge status: Home / Self Care | Payer: 59 | Source: Ambulatory Visit | Attending: Internal Medicine | Admitting: Internal Medicine

## 2022-09-11 DIAGNOSIS — D869 Sarcoidosis, unspecified: Secondary | ICD-10-CM | POA: Diagnosis present

## 2022-09-11 NOTE — Telephone Encounter (Signed)
Please let the patient know that we confirmed that her PA for [facility] was denied.  They need her to try Breo, fluticasone/salmeterol (generic AirDuo RespiClick).  We can try either of these that the patient would be willing to do

## 2022-09-18 ENCOUNTER — Encounter: Payer: Self-pay | Admitting: Emergency Medicine

## 2022-09-20 ENCOUNTER — Other Ambulatory Visit: Payer: Self-pay | Admitting: Emergency Medicine

## 2022-09-20 DIAGNOSIS — I2510 Atherosclerotic heart disease of native coronary artery without angina pectoris: Secondary | ICD-10-CM

## 2022-09-20 NOTE — Progress Notes (Signed)
Referral to cardiology

## 2022-10-03 ENCOUNTER — Encounter: Payer: Self-pay | Admitting: Emergency Medicine

## 2022-10-04 ENCOUNTER — Ambulatory Visit: Payer: 59

## 2022-10-04 VITALS — BP 130/60 | HR 81 | Ht 67.0 in | Wt 165.4 lb

## 2022-10-04 DIAGNOSIS — R072 Precordial pain: Secondary | ICD-10-CM

## 2022-10-04 DIAGNOSIS — R0609 Other forms of dyspnea: Secondary | ICD-10-CM

## 2022-10-04 DIAGNOSIS — D869 Sarcoidosis, unspecified: Secondary | ICD-10-CM | POA: Diagnosis not present

## 2022-10-04 DIAGNOSIS — R079 Chest pain, unspecified: Secondary | ICD-10-CM

## 2022-10-04 HISTORY — DX: Other forms of dyspnea: R06.09

## 2022-10-04 HISTORY — DX: Chest pain, unspecified: R07.9

## 2022-10-04 MED ORDER — CLOPIDOGREL BISULFATE 75 MG PO TABS: 75.0000 mg | ORAL_TABLET | Freq: Every day | ORAL | 3 refills | Status: DC

## 2022-10-04 MED ORDER — METOPROLOL TARTRATE 50 MG PO TABS: ORAL_TABLET | ORAL | 0 refills | Status: DC

## 2022-10-04 NOTE — Patient Instructions (Signed)
Medication Instructions:  Your physician has recommended you make the following change in your medication:   START: Plavix 75 mg daily  *If you need a refill on your cardiac medications before your next appointment, please call your pharmacy*   Lab Work: Your physician recommends that you return for lab work in:   Labs 1 week before CT: BMP  If you have labs (blood work) drawn today and your tests are completely normal, you will receive your results only by: MyChart Message (if you have MyChart) OR A paper copy in the mail If you have any lab test that is abnormal or we need to change your treatment, we will call you to review the results.   Testing/Procedures: Your physician has requested that you have an echocardiogram. Echocardiography is a painless test that uses sound waves to create images of your heart. It provides your doctor with information about the size and shape of your heart and how well your heart's chambers and valves are working. This procedure takes approximately one hour. There are no restrictions for this procedure. Please do NOT wear cologne, perfume, aftershave, or lotions (deodorant is allowed). Please arrive 15 minutes prior to your appointment time.  A zio monitor was ordered today. It will remain on for 7 days. You will then return monitor and event diary in provided box. It takes 1-2 weeks for report to be downloaded and returned to [place]. We will call you with the results. If monitor falls off or has orange flashing light, please call Zio for further instructions.   Cardiac PET scan     Your cardiac CT will be scheduled at one of the below locations:   [facility] [address] [place], [place] [address] [phone]  OR  [facility] [address] [place], [place] [address] [phone]  OR   [facility] [address] [place], [place] [address] [phone]  If scheduled at [facility], please arrive at the [facility] En[facility]ance (Entrance C2) of [facility] 30 minutes prior to test start time. You can use the FREE valet parking offered at entrance C (encouraged to control the heart rate for the test)  Proceed to the [facility] Radiology Department (first floor) to check-in and test prep.  All radiology patients and guests should use entrance C2 at [facility], accessed from [facility], even though the hospital's physical address listed is [address]    If scheduled at [facility] or [facility], please arrive 15 mins early for check-in and test prep.  There is spacious parking and easy access to the radiology department from the [facility] entrance. Please enter here and check-in with the desk attendant.   Please follow these instructions carefully (unless otherwise directed):  On the Night Before the Test: Be sure to Drink plenty of water. Do not consume any caffeinated/decaffeinated beverages or chocolate 12 hours prior to your test. Do not take any antihistamines 12 hours prior to your test.  On the Day of the Test: Drink plenty of water until 1 hour prior to the test. Do not eat any food 1 hour prior to test. You may take your regular medications prior to the test.  Take metoprolol (Lopressor) as prescribed FEMALES- please wear underwire-free bra if available, avoid dresses & tight clothing      After the Test: Drink plenty of  water. After receiving IV contrast, you may experience a mild flushed feeling. This is normal. On occasion, you may experience a mild rash up to 24 hours after the test. This is not dangerous. If this occurs, you can take Benadryl 25 mg and increase your fluid intake. If you experience trouble breathing, this can be serious. If it is severe call 911 IMMEDIATELY. If it is mild, please  call our office.  We will call to schedule your test 2-4 weeks out understanding that some insurance companies will need an authorization prior to the service being performed.   For more information and frequently asked questions, please visit our website : [email or web address]  For non-scheduling related questions, please contact the cardiac imaging nurse navigator should you have any questions/concerns: Cardiac Imaging Nurse Navigators Direct Office Dial: [phone]   For scheduling needs, including cancellations and rescheduling, please call [place], [phone].    Follow-Up: At [facility], you and your health needs are our priority.  As part of our continuing mission to provide you with exceptional heart care, we have created designated Provider Care Teams.  These Care Teams include your primary Cardiologist (physician) and Advanced Practice Providers (APPs -  Physician Assistants and Nurse Practitioners) who all work together to provide you with the care you need, when you need it.  We recommend signing up for the patient portal called "MyChart".  Sign up information is provided on this After Visit Summary.  MyChart is used to connect with patients for Virtual Visits (Telemedicine).  Patients are able to view lab/test results, encounter notes, upcoming appointments, etc.  Non-urgent messages can be sent to your provider as well.   To learn more about what you can do with MyChart, go to [email or web address].    Your next appointment:   2 month(s)  Provider:   Huntley Dec, MD    Other Instructions None

## 2022-10-04 NOTE — Assessment & Plan Note (Signed)
Similar to the plan as above under chest pain on exertion.

## 2022-10-04 NOTE — Assessment & Plan Note (Signed)
Exertional symptoms of chest pain, could be anginal equivalent. This in association with dyspnea on exertion and her age, we reviewed underlying potential coronary artery disease concerns.  Discussed further evaluation for CAD with imaging studies such as CT coronary angiogram versus cardiac catheterization versus stress testing. Given his intensity of symptoms and progressive nature, decided to proceed with CT coronary angiogram which will give [place] for an assessment for any obstructive coronary artery disease, plaque burden if any.  Will schedule for CT coronary angiogram. Metoprolol tartrate 50 mg on the night before and 50 mg on the morning of the test to optimize heart rate further imaging.  Will also obtain a transthoracic echocardiogram to assess cardiac structure and function.  Advised her to start taking Plavix 75 mg once daily as she mentions significant side effects with aspirin in the past given her GERD and heartburn. Will continue with this until we have further assessment for her CAD with the imaging tests above.  Advised her to avoid any moderate to heavy exertion until the testing is completed.

## 2022-10-04 NOTE — Assessment & Plan Note (Signed)
Known history of pulmonary sarcoidosis. No prior cardiac work for involvement. Will proceed with cardiac PET/CT to be done for sarcoidosis involvement evaluation.  Will also obtain a 7-day event monitor to exclude any potential underlying cardiac arrhythmias.

## 2022-10-04 NOTE — Progress Notes (Signed)
Cardiology Consultation:    Date:  10/04/2022   ID:  Stefanie Orozco, DOB [birth year], MRN [id number]  PCP:  Andi Devon, MD  Cardiologist:  Marlyn Corporal Sofia Jaquith, MD   Referring MD: Leslye Peer, MD   No chief complaint on file. Dyspnea on exertion and chest pain   ASSESSMENT AND PLAN:   Ms. [name] 62 year old woman with history of pulmonary sarcoidosis, obstructive sleep apnea, chronic cough, GERD, small hiatal hernia, aortic atherosclerosis noted on CT with progressive symptoms of dyspnea on exertion and chest pain on exertion the angina reaccumulate.  Problem List Items Addressed This Visit     SARCOIDOSIS, PULMONARY    Known history of pulmonary sarcoidosis. No prior cardiac work for involvement. Will proceed with cardiac PET/CT to be done for sarcoidosis involvement evaluation.  Will also obtain a 7-day event monitor to exclude any potential underlying cardiac arrhythmias.       Relevant Orders   ECHOCARDIOGRAM COMPLETE   LONG TERM MONITOR (3-14 DAYS)   NM PET CT MYOCARDIAL SARCOIDOSIS   Dyspnea on exertion - Primary    Similar to the plan as above under chest pain on exertion.      Relevant Orders   ECHOCARDIOGRAM COMPLETE   LONG TERM MONITOR (3-14 DAYS)   NM PET CT MYOCARDIAL SARCOIDOSIS   Chest pain on exertion    Exertional symptoms of chest pain, could be anginal equivalent. This in association with dyspnea on exertion and her age, we reviewed underlying potential coronary artery disease concerns.  Discussed further evaluation for CAD with imaging studies such as CT coronary angiogram versus cardiac catheterization versus stress testing. Given his intensity of symptoms and progressive nature, decided to proceed with CT coronary angiogram which will give [place] for an assessment for any obstructive coronary artery disease, plaque burden if any.  Will schedule for CT coronary angiogram. Metoprolol tartrate 50 mg on the night before and 50 mg on the morning  of the test to optimize heart rate further imaging.  Will also obtain a transthoracic echocardiogram to assess cardiac structure and function.  Advised her to start taking Plavix 75 mg once daily as she mentions significant side effects with aspirin in the past given her GERD and heartburn. Will continue with this until we have further assessment for her CAD with the imaging tests above.  Advised her to avoid any moderate to heavy exertion until the testing is completed.       Relevant Orders   EKG 12-Lead (Completed)   ECHOCARDIOGRAM COMPLETE   LONG TERM MONITOR (3-14 DAYS)   NM PET CT MYOCARDIAL SARCOIDOSIS   Other Visit Diagnoses     Precordial pain       Relevant Orders   ECHOCARDIOGRAM COMPLETE   LONG TERM MONITOR (3-14 DAYS)   NM PET CT MYOCARDIAL SARCOIDOSIS   CT CORONARY MORPH W/CTA COR W/SCORE W/CA W/CM &/OR WO/CM         History of Present Illness:    Stefanie Orozco is a 62 y.o. female who is being seen today for the evaluation of chest pain and shortness of breath on exertion at the request of Byrum, Les Pou, MD.  Here for the visit accompanied by her husband.  She has history of pulmonary sarcoidosis,  obstructive sleep apnea uses CPAP, chronic cough related to these, GERD, small hiatal hernia, aortic atherosclerosis on CT chest August 2024. Denies any prior cardiac history of echocardiogram or stress test.  Denies any prior evaluation for cardiac sarcoidosis.  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  Has been dealing with a flareup of what she describes as pulmonary sarcoidosis for the past 10 to 12 months since she came off of Dulera.  Mentions chronic cough nonproductive.  Symptoms not significantly improved despite steroids.  Symptoms somewhat progressive over the past 8 months and associated with chest pain with exertion which she describes as heaviness and pressure, relieved with extended rest.  Denies any palpitations, syncopal episodes. Denies any  significant weight changes Denies any pedal edema Denies any orthopnea.  EKG today in the clinic shows sinus rhythm heart rate 81/min, PR interval 144 ms.  Does not smoke. Drinks 1 glass of wine every night.  Mentions good compliance with medications. Unable to take aspirin due to significant reflux symptoms. Unable to take proton pump inhibitors due to side effects.  Remains on famotidine which is helpful.  History of blood in stool secondary to hemorrhoids in the past and underwent banding.  Sarcoidosis Dxed 1998 in [place] PFT's 03/2010: no obstruction, TLC 4.21 (70%), DLCO 68% Primarily manifests as cough, controlled with LABA/ICS PFT's 05/2012:  No obstruction, TLC 3.74 (69% pred), DLCO 65%     Past Medical History:  Diagnosis Date   GERD (gastroesophageal reflux disease)    Hidradenitis    Pinched nerve in neck    Pulmonary sarcoidosis (HCC)    Sarcoid 1998   ACE> 400, compatible cxr    Past Surgical History:  Procedure Laterality Date   ABLATION ON ENDOMETRIOSIS     BREAST SURGERY     LAPAROSCOPIC TUBAL LIGATION     laproscopic      Current Medications: Current Meds  Medication Sig   albuterol (VENTOLIN HFA) 108 (90 Base) MCG/ACT inhaler INHALE 1 TO 2 PUFFS BY MOUTH EVERY 4 TO 6 HOURS AS NEEDED   Budeson-Glycopyrrol-Formoterol (BREZTRI AEROSPHERE) 160-9-4.8 MCG/ACT AERO Inhale 2 puffs into the lungs daily.   clopidogrel (PLAVIX) 75 MG tablet Take 1 tablet (75 mg total) by mouth daily.   fexofenadine (ALLEGRA ALLERGY) 180 MG tablet Take 1 tablet (180 mg total) by mouth daily for 15 days.   metoprolol tartrate (LOPRESSOR) 50 MG tablet Please take 1 tablet = 50 mg the night before your CT, then take 1 tablet = 50 mg the morning of your CT.   montelukast (SINGULAIR) 10 MG tablet Take 1 tablet (10 mg total) by mouth at bedtime.   Multiple Vitamin (MULTIVITAMIN WITH MINERALS) TABS tablet Take 1 tablet by mouth daily.   NON FORMULARY Lidiapincum- OTC herbal  supplement for hot flashes 1 tab bid   omeprazole (PRILOSEC) 40 MG capsule Take 40 mg by mouth.     Allergies:   Codeine and Almond oil   Social History   Socioeconomic History   Marital status: Married    Spouse name: Not on file   Number of children: Not on file   Years of education: Not on file   Highest education level: Not on file  Occupational History   Not on file  Tobacco Use   Smoking status: Former    Current packs/day: 0.00    Types: Cigarettes    Quit date: 01/29/1985    Years since quitting: 37.7   Smokeless tobacco: Never  Vaping Use   Vaping status: Never Used  Substance and Sexual Activity   Alcohol use: No   Drug use: No   Sexual activity: Not on file  Other Topics Concern   Not on file  Social  History Narrative   Not on file   Social Determinants of Health   Financial Resource Strain: Low Risk  (03/11/2022)   Received from [facility], [facility]   Overall Financial Resource Strain (CARDIA)    Difficulty of Paying Living Expenses: Not hard at all  Food Insecurity: No Food Insecurity (03/11/2022)   Received from [facility], [facility]   Hunger Vital Sign    Worried About Running Out of Food in the Last Year: Never true    Ran Out of Food in the Last Year: Never true  Transportation Needs: No Transportation Needs (03/11/2022)   Received from [facility], [facility]   PRAPARE - Transportation    Lack of Transportation (Medical): No    Lack of Transportation (Non-Medical): No  Physical Activity: Insufficiently Active (03/11/2022)   Received from [facility], [facility]   Exercise Vital Sign    Days of Exercise per Week: 2 days    Minutes of Exercise per Session: 30 min  Stress: No Stress Concern Present (03/11/2022)   Received from [facility], [facility] of Occupational Health - Occupational Stress Questionnaire    Feeling of Stress : Only a little  Social Connections: Moderately Integrated (03/11/2022)    Received from [facility], [facility]   Social Network    How would you rate your social network (family, work, friends)?: Adequate participation with social networks     Family History: The patient's family history includes Hypertension in her father and mother; Stroke in her mother. ROS:   Please see the history of present illness.    All 14 point review of systems negative except as described per history of present illness.  EKGs/Labs/Other Studies Reviewed:    The following studies were reviewed today:   EKG:  EKG Interpretation Date/Time:  Thursday October 04 2022 15:13:56 EDT Ventricular Rate:  81 PR Interval:  144 QRS Duration:  70 QT Interval:  376 QTC Calculation: 436 R Axis:   11  Text Interpretation: Normal sinus rhythm Normal ECG No previous ECGs available Confirmed by Huntley Dec reddy [phone]) on 10/04/2022 3:23:26 PM    Recent Labs: No results found for requested labs within last 365 days.  Recent Lipid Panel No results found for: "CHOL", "TRIG", "HDL", "CHOLHDL", "VLDL", "LDLCALC", "LDLDIRECT"  Physical Exam:    VS:  BP 130/60 (BP Location: Left Arm, Patient Position: Sitting, Cuff Size: Normal)   Pulse 81   Ht 5\' 7"  (1.702 m)   Wt 165 lb 6.4 oz (75 kg)   SpO2 98%   BMI 25.91 kg/m     Wt Readings from Last 3 Encounters:  10/04/22 165 lb 6.4 oz (75 kg)  08/29/22 166 lb 6.4 oz (75.5 kg)  02/15/22 162 lb 12.8 oz (73.8 kg)     GENERAL:  Well nourished, well developed in no acute distress NECK: No JVD; No carotid bruits CARDIAC: RRR, S1 and S2 present, no murmurs, no rubs, no gallops CHEST:  Clear to auscultation without rales, wheezing or rhonchi  Extremities: No pitting pedal edema. Pulses bilaterally symmetric with radial 2+ and dorsalis pedis 2+ NEUROLOGIC:  Alert and oriented x 3  Medication Adjustments/Labs and Tests Ordered: Current medicines are reviewed at length with the patient today.  Concerns regarding medicines are outlined  above.  Orders Placed This Encounter  Procedures   NM PET CT MYOCARDIAL SARCOIDOSIS   CT CORONARY MORPH W/CTA COR W/SCORE W/CA W/CM &/OR WO/CM   LONG TERM MONITOR (  3-14 DAYS)   EKG 12-Lead   ECHOCARDIOGRAM COMPLETE   Meds ordered this encounter  Medications   clopidogrel (PLAVIX) 75 MG tablet    Sig: Take 1 tablet (75 mg total) by mouth daily.    Dispense:  90 tablet    Refill:  3   metoprolol tartrate (LOPRESSOR) 50 MG tablet    Sig: Please take 1 tablet = 50 mg the night before your CT, then take 1 tablet = 50 mg the morning of your CT.    Dispense:  2 tablet    Refill:  0    Signed, Brittnay Pigman reddy Zavon Hyson, MD, MPH, [facility]. 10/04/2022 4:01 PM     [facility]

## 2022-10-08 NOTE — Telephone Encounter (Signed)
A copy of the denial letter has been sent to fax queue to be uploaded to patients media. Will update once this is complete.

## 2022-10-09 ENCOUNTER — Ambulatory Visit (HOSPITAL_BASED_OUTPATIENT_CLINIC_OR_DEPARTMENT_OTHER)
Admission: RE | Admit: 2022-10-09 | Discharge: 2022-10-09 | Disposition: A | Discharge status: Home / Self Care | Payer: 59 | Source: Ambulatory Visit

## 2022-10-09 DIAGNOSIS — R0609 Other forms of dyspnea: Secondary | ICD-10-CM | POA: Diagnosis present

## 2022-10-09 DIAGNOSIS — R079 Chest pain, unspecified: Secondary | ICD-10-CM | POA: Diagnosis present

## 2022-10-09 DIAGNOSIS — D869 Sarcoidosis, unspecified: Secondary | ICD-10-CM

## 2022-10-09 DIAGNOSIS — R072 Precordial pain: Secondary | ICD-10-CM | POA: Diagnosis present

## 2022-10-09 LAB — ECHOCARDIOGRAM COMPLETE
AR max vel: 1.91 cm2
AV Area VTI: 1.85 cm2
AV Area mean vel: 1.73 cm2
AV Mean grad: 3 mmHg
AV Peak grad: 5.4 mmHg
Ao pk vel: 1.17 m/s
Area-P 1/2: 3.06 cm2
Calc EF: 55.1 %
S' Lateral: 2.5 cm
Single Plane A2C EF: 57 %
Single Plane A4C EF: 56.2 %

## 2022-10-11 ENCOUNTER — Ambulatory Visit (INDEPENDENT_AMBULATORY_CARE_PROVIDER_SITE_OTHER): Payer: 59 | Admitting: Emergency Medicine

## 2022-10-11 ENCOUNTER — Encounter: Payer: Self-pay | Admitting: Emergency Medicine

## 2022-10-11 ENCOUNTER — Encounter (HOSPITAL_COMMUNITY): Payer: Self-pay

## 2022-10-11 VITALS — BP 126/86 | HR 87 | Temp 98.7°F | Ht 67.0 in | Wt 165.0 lb

## 2022-10-11 DIAGNOSIS — D869 Sarcoidosis, unspecified: Secondary | ICD-10-CM

## 2022-10-11 MED ORDER — ALBUTEROL SULFATE (2.5 MG/3ML) 0.083% IN NEBU
2.5000 mg | INHALATION_SOLUTION | Freq: Once | RESPIRATORY_TRACT | Status: AC
Start: 2022-10-11 — End: 2022-10-11
  Administered 2022-10-11: 2.5 mg via RESPIRATORY_TRACT

## 2022-10-11 MED ORDER — FLUTICASONE-SALMETEROL 55-14 MCG/ACT IN AEPB: 1.0000 | INHALATION_SPRAY | Freq: Two times a day (BID) | RESPIRATORY_TRACT | 2 refills | Status: DC

## 2022-10-11 MED ORDER — ALBUTEROL SULFATE (2.5 MG/3ML) 0.083% IN NEBU: 2.5000 mg | INHALATION_SOLUTION | Freq: Four times a day (QID) | RESPIRATORY_TRACT | 5 refills | Status: DC | PRN

## 2022-10-11 NOTE — Addendum Note (Signed)
Addended byClyda Greener M on: 10/11/2022 05:38 PM   Modules accepted: Orders

## 2022-10-11 NOTE — Assessment & Plan Note (Signed)
Still with chest tightness and cough even after being treated for 3 weeks with treatment dose prednisone.  I am skeptical that this is for sarcoidosis.  The CT scan of the chest was unrevealing, stable from priors.  She does not have wheeze on exam.  She should have responded to the prednisone.  I question whether this may be an occult GERD.  She has had breakthrough symptoms in the past even after her fundoplication.  The sensation is not consistent with her prior GERD symptoms and so she is skeptical.  She has done the best in the past on Dulera.  We have had a difficult time getting it because her insurance will not pay for it.  She has to fail Breo, fail AirDuo.  She has failed the [facility] and I will order air duo now.  If she fails this then I will do a prior authorization request again for the [facility].  We will stop the [facility] for now.  Continue the Airsupra as needed.  She does feel that she gets some benefit from nebulized albuterol.  Will give her a treatment now and provide a nebulizer and albuterol nebs for her at home.  She needs pulmonary function testing to quantify any degree of obstruction.  Her PFT and 2022 were normal and this presentation, especially the failure to respond to prednisone, is inconsistent with obstructive lung disease.

## 2022-10-11 NOTE — Progress Notes (Signed)
Subjective:    Patient ID: Stefanie Orozco, female    DOB: 1960/05/02, 62 y.o.   MRN: [id number]  HPI  ROV 08/29/2022 --follow-up visit for 62 year old woman with history of presumed sarcoidosis based on elevated ACE level and scattered parenchymal interstitial infiltrates on CT scan of the chest.  She has associated mild obstructive lung disease, OSA on CPAP, upper airway irritation syndrome with rhinitis.  She used to be on [facility] but is having a difficult time getting this due to insurance, we tried a prior authorization but she has to fail AirDuo or Breo first. She hasn't been on her Elwin Sleight for over a year. She is having persistent cough, especially at night. Her exercise tolerance has declined. Using albuterol more frequently. Her ACE Level w Dr Bernette Mayers was significantly elevated at her last OV - I don't have that result.   She is on Allegra, Singulair, Prilosec 40 mg daily   ROV 10/11/2022 --Stefanie Orozco is 31 with a history of presumed sarcoidosis based on her ACE level and some scattered parenchymal infiltrates on CT scan of the chest.  She has associated mild obstructive lung disease.  She also has upper airway irritation syndrome with rhinitis, OSA on CPAP.  I saw her in late July for progressive dyspnea and unrelenting cough.  She had come off of Dulera because her insurance required prior authorization.  She was supposed to fail Breo and AirDuo.  I started her on the [facility] but she did not tolerate. I gave gave her treatment dose prednisone for sarcoidosis but she did not get any benefit. She did have standard side effects.   She is currently on [place] as needed. She still feels chest tightness, hoarseness. Coughing day and night. She has seen Cards, is planning for cardiac nuclear testing. She is dyspneic with exertion.   CT chest 09/11/22 no mediastinal or hilar adenopathy, no consolidation.  Solid small bilateral pulmonary nodules, largest 0.3 cm (smaller) in the right lower  lobe.  Some patchy central peribronchovascular interstitial thickening and associated traction.  No honeycomb.  No groundglass.  No evidence of active sarcoidosis  PFT11/11/2020 reviewed normal airflows without a bronchodilator response, decreased lung volumes, normal diffusion capacity.  TTE 10/09/2022 normal LV function, EF 60-65%, grade 1 diastolic dysfunction.  Normal RV size and function.  Review of Systems As per HPI  Past Medical History:  Diagnosis Date   GERD (gastroesophageal reflux disease)    Hidradenitis    Pinched nerve in neck    Pulmonary sarcoidosis (HCC)    Sarcoid 1998   ACE> 400, compatible cxr     Family History  Problem Relation Age of Onset   Stroke Mother    Hypertension Mother    Hypertension Father      Social History   Socioeconomic History   Marital status: Married    Spouse name: Not on file   Number of children: Not on file   Years of education: Not on file   Highest education level: Not on file  Occupational History   Not on file  Tobacco Use   Smoking status: Former    Current packs/day: 0.00    Types: Cigarettes    Quit date: 01/29/1985    Years since quitting: 37.7   Smokeless tobacco: Never  Vaping Use   Vaping status: Never Used  Substance and Sexual Activity   Alcohol use: No   Drug use: No   Sexual activity: Not on file  Other Topics Concern  Not on file  Social History Narrative   Not on file   Social Determinants of Health   Financial Resource Strain: Low Risk  (03/11/2022)   Received from [facility], [facility]   Overall Financial Resource Strain (CARDIA)    Difficulty of Paying Living Expenses: Not hard at all  Food Insecurity: No Food Insecurity (03/11/2022)   Received from [facility], [facility]   Hunger Vital Sign    Worried About Running Out of Food in the Last Year: Never true    Ran Out of Food in the Last Year: Never true  Transportation Needs: No Transportation Needs (03/11/2022)   Received from  [facility], [facility]   PRAPARE - Transportation    Lack of Transportation (Medical): No    Lack of Transportation (Non-Medical): No  Physical Activity: Insufficiently Active (03/11/2022)   Received from [facility], [facility]   Exercise Vital Sign    Days of Exercise per Week: 2 days    Minutes of Exercise per Session: 30 min  Stress: No Stress Concern Present (03/11/2022)   Received from [facility], [facility] of Occupational Health - Occupational Stress Questionnaire    Feeling of Stress : Only a little  Social Connections: Moderately Integrated (03/11/2022)   Received from [facility], [facility]   Social Network    How would you rate your social network (family, work, friends)?: Adequate participation with social networks  Intimate Partner Violence: Not At Risk (03/11/2022)   Received from [facility], [facility]   HITS    Over the last 12 months how often did your partner physically hurt you?: 1    Over the last 12 months how often did your partner insult you or talk down to you?: 1    Over the last 12 months how often did your partner threaten you with physical harm?: 1    Over the last 12 months how often did your partner scream or curse at you?: 2     Allergies  Allergen Reactions   Codeine    Almond Oil Other (See Comments)    Anything with ALMONDS     Outpatient Medications Prior to Visit  Medication Sig Dispense Refill   AIRSUPRA 90-80 MCG/ACT AERO SMARTSIG:2 Inhalation Via Inhaler Twice Daily     albuterol (VENTOLIN HFA) 108 (90 Base) MCG/ACT inhaler INHALE 1 TO 2 PUFFS BY MOUTH EVERY 4 TO 6 HOURS AS NEEDED 8.5 each 10   clopidogrel (PLAVIX) 75 MG tablet Take 1 tablet (75 mg total) by mouth daily. 90 tablet 3   famotidine (PEPCID) 40 MG tablet Take 40 mg by mouth daily.     fexofenadine (ALLEGRA ALLERGY) 180 MG tablet Take 1 tablet (180 mg total) by mouth daily for 15 days. 15 tablet 0   metoprolol tartrate (LOPRESSOR)  50 MG tablet Please take 1 tablet = 50 mg the night before your CT, then take 1 tablet = 50 mg the morning of your CT. 2 tablet 0   montelukast (SINGULAIR) 10 MG tablet Take 1 tablet (10 mg total) by mouth at bedtime. 90 tablet 3   Multiple Vitamin (MULTIVITAMIN WITH MINERALS) TABS tablet Take 1 tablet by mouth daily.     NON FORMULARY Lidiapincum- OTC herbal supplement for hot flashes 1 tab bid     omeprazole (PRILOSEC) 40 MG capsule Take 40 mg by mouth.     Budeson-Glycopyrrol-Formoterol (BREZTRI AEROSPHERE) 160-9-4.8 MCG/ACT AERO Inhale 2 puffs into the lungs daily.  No facility-administered medications prior to visit.          Objective:   Physical Exam Vitals:   10/11/22 1610  BP: 126/86  Pulse: 87  Temp: 98.7 F (37.1 C)  TempSrc: Oral  SpO2: 97%  Weight: 165 lb (74.8 kg)  Height: 5\' 7"  (1.702 m)   Gen: Pleasant, well-nourished, in no distress, normal affect  ENT: No lesions,  mouth clear,  oropharynx clear, slightly hoarse voice  Neck: No JVD, no stridor  Lungs: No use of accessory muscles, clear bilaterally.  She does cough with a deep inspiration  Cardiovascular: RRR, heart sounds normal, no murmur or gallops, no peripheral edema  Musculoskeletal: No deformities, no cyanosis or clubbing  Neuro: alert, non focal  Skin: Warm, no lesions or rashes      Assessment & Plan:  SARCOIDOSIS, PULMONARY Still with chest tightness and cough even after being treated for 3 weeks with treatment dose prednisone.  I am skeptical that this is for sarcoidosis.  The CT scan of the chest was unrevealing, stable from priors.  She does not have wheeze on exam.  She should have responded to the prednisone.  I question whether this may be an occult GERD.  She has had breakthrough symptoms in the past even after her fundoplication.  The sensation is not consistent with her prior GERD symptoms and so she is skeptical.  She has done the best in the past on Dulera.  We have had a  difficult time getting it because her insurance will not pay for it.  She has to fail Breo, fail AirDuo.  She has failed the [facility] and I will order air duo now.  If she fails this then I will do a prior authorization request again for the [facility].  We will stop the [facility] for now.  Continue the Airsupra as needed.  She does feel that she gets some benefit from nebulized albuterol.  Will give her a treatment now and provide a nebulizer and albuterol nebs for her at home.  She needs pulmonary function testing to quantify any degree of obstruction.  Her PFT and 2022 were normal and this presentation, especially the failure to respond to prednisone, is inconsistent with obstructive lung disease.  Time spent 55 minutes  Levy Pupa, MD, PhD 10/11/2022, 5:13 PM [facility] [phone] or if no answer [phone]

## 2022-10-11 NOTE — Patient Instructions (Addendum)
We will give you an albuterol nebulizer treatment now We will order a nebulizer machine and albuterol nebs for you to have at home to use up to every 4 hours if needed for shortness of breath, chest tightness, wheezing. Stop Markus Daft We will try starting AirDuo.  If you do not benefit from this medication then we will do a prior authorization request for [facility] and restart this medication. Keep your Paulene Floor available to use 2 puffs up to every 4 hours if needed for shortness of breath, chest tightness, wheezing. We reviewed your CT scan of the chest.  There is some subtle inflammatory change present but no evidence of active sarcoidosis. We will repeat your pulmonary function testing to compare with priors. Get your cardiac evaluation and nuclear cardiac scan as planned Would strongly consider a therapeutic trial of getting back on famotidine twice a day. Follow with Dr Delton Coombes next available after your PFT so we can review results.

## 2022-10-12 ENCOUNTER — Telehealth (HOSPITAL_COMMUNITY): Payer: Self-pay | Admitting: Emergency Medicine

## 2022-10-12 ENCOUNTER — Encounter (HOSPITAL_COMMUNITY): Payer: Self-pay

## 2022-10-12 NOTE — Telephone Encounter (Signed)
Reaching out to patient to offer assistance regarding upcoming cardiac imaging study; pt verbalizes understanding of appt date/time, parking situation and where to check in, pre-test NPO status and medications ordered, and verified current allergies; name and call back number provided for further questions should they arise Cayne Yom RN Navigator Cardiac Imaging [facility] [phone] office [phone] cell 

## 2022-10-12 NOTE — Telephone Encounter (Signed)
Attempted to call patient regarding upcoming cardiac CT appointment. °Left message on voicemail with name and callback number °Dwan Fennel RN Navigator Cardiac Imaging °[facility] °[phone] Office °[phone] Cell ° °

## 2022-10-15 ENCOUNTER — Telehealth: Payer: Self-pay

## 2022-10-15 ENCOUNTER — Ambulatory Visit (HOSPITAL_BASED_OUTPATIENT_CLINIC_OR_DEPARTMENT_OTHER)
Admission: RE | Admit: 2022-10-15 | Discharge: 2022-10-15 | Disposition: A | Discharge status: Home / Self Care | Payer: 59 | Source: Ambulatory Visit

## 2022-10-15 DIAGNOSIS — R072 Precordial pain: Secondary | ICD-10-CM | POA: Insufficient documentation

## 2022-10-15 MED ORDER — NITROGLYCERIN 0.4 MG SL SUBL
0.8000 mg | SUBLINGUAL_TABLET | Freq: Once | SUBLINGUAL | Status: AC
Start: 2022-10-15 — End: 2022-10-15
  Administered 2022-10-15: 0.8 mg via SUBLINGUAL

## 2022-10-15 MED ORDER — IOHEXOL 350 MG/ML SOLN
100.0000 mL | Freq: Once | INTRAVENOUS | Status: AC | PRN
  Administered 2022-10-15: 100 mL via INTRAVENOUS

## 2022-10-15 NOTE — Telephone Encounter (Signed)
Patient would like to know why labs were ordered. Please advise.

## 2022-10-15 NOTE — Telephone Encounter (Signed)
Left voice mail for pt that BMP was ordered for CT scan for evaluation of kidney function.

## 2022-10-15 NOTE — Progress Notes (Signed)
Patient presented for cardiac CT. Patient tolerated well, denies symptoms.  Patient ambulated out of department with steady gait.

## 2022-10-15 NOTE — Telephone Encounter (Signed)
Patient is returning call. She states she had her CT this morning and she would like to know if she still needs to have lab work.

## 2022-10-23 ENCOUNTER — Encounter: Payer: Self-pay | Admitting: Emergency Medicine

## 2022-10-24 NOTE — Progress Notes (Signed)
Hi Stefanie Orozco, Sent a note to Ms. [name] on [employer]. Normal results. I recommended to discontinue Plavix that we started at the office visit.

## 2022-10-30 ENCOUNTER — Other Ambulatory Visit (HOSPITAL_COMMUNITY): Payer: Self-pay

## 2022-10-30 ENCOUNTER — Telehealth: Payer: Self-pay

## 2022-10-30 MED ORDER — MOMETASONE FURO-FORMOTEROL FUM 100-5 MCG/ACT IN AERO: 2.0000 | INHALATION_SPRAY | Freq: Two times a day (BID) | RESPIRATORY_TRACT | 3 refills | Status: DC

## 2022-10-30 NOTE — Telephone Encounter (Signed)
PA request has been Submitted. New Encounter created for follow up. For additional info see Pharmacy Prior Auth telephone encounter from 10-01.

## 2022-10-30 NOTE — Telephone Encounter (Signed)
Pharmacy Patient Advocate Encounter   Received notification from Patient Advice Request messages that prior authorization for [facility] 100-5MCG/ACT aerosol is required/requested.   Insurance verification completed.   The patient is insured through [facility] .   Per test claim: PA required; PA submitted to [facility] via CoverMyMeds Key/confirmation #/EOC [id number] Status is pending

## 2022-11-01 ENCOUNTER — Telehealth: Payer: Self-pay

## 2022-11-01 NOTE — Telephone Encounter (Signed)
Patient viewed results on Mychart. Routed to PCP

## 2022-11-01 NOTE — Telephone Encounter (Signed)
-----   Message from Stefanie Orozco sent at 10/24/2022 10:35 AM EDT ----- Sherryl Manges, Sent a note to Ms. [name] on [employer]. Normal results. I recommended to discontinue Plavix that we started at the office visit.

## 2022-11-02 ENCOUNTER — Encounter (HOSPITAL_COMMUNITY): Payer: Self-pay

## 2022-11-07 ENCOUNTER — Encounter (HOSPITAL_COMMUNITY): Payer: 59

## 2022-11-07 ENCOUNTER — Encounter (HOSPITAL_COMMUNITY)
Admission: RE | Admit: 2022-11-07 | Discharge: 2022-11-07 | Disposition: A | Discharge status: Home / Self Care | Payer: 59 | Source: Ambulatory Visit

## 2022-11-07 DIAGNOSIS — R079 Chest pain, unspecified: Secondary | ICD-10-CM | POA: Insufficient documentation

## 2022-11-07 DIAGNOSIS — D869 Sarcoidosis, unspecified: Secondary | ICD-10-CM | POA: Diagnosis not present

## 2022-11-07 DIAGNOSIS — R072 Precordial pain: Secondary | ICD-10-CM | POA: Insufficient documentation

## 2022-11-07 DIAGNOSIS — R0609 Other forms of dyspnea: Secondary | ICD-10-CM | POA: Diagnosis present

## 2022-11-07 MED ORDER — FLUDEOXYGLUCOSE F - 18 (FDG) INJECTION
8.6000 | Freq: Once | INTRAVENOUS | Status: AC
  Administered 2022-11-07: 8.6 via INTRAVENOUS

## 2022-11-07 MED ORDER — RUBIDIUM RB82 GENERATOR (RUBYFILL)
20.0000 | PACK | Freq: Once | INTRAVENOUS | Status: AC
  Administered 2022-11-07: 19.69 via INTRAVENOUS

## 2022-11-07 NOTE — Telephone Encounter (Signed)
Pharmacy Patient Advocate Encounter  Received notification from [facility] that Prior Authorization for [facility] 100-5MCG/ACT aerosol has been APPROVED from 10-30-2022 to 10-30-2023   PA #/Case ID/Reference #: [id number]

## 2022-11-14 ENCOUNTER — Telehealth: Payer: Self-pay

## 2022-11-14 NOTE — Telephone Encounter (Signed)
Patient notified of results and verbalized understanding.  

## 2022-11-14 NOTE — Telephone Encounter (Signed)
-----   Message from Marlyn Corporal Madireddy sent at 11/08/2022  5:49 PM EDT ----- Results about the event monitor findings sent to her on MyChart.  If she reaches out regarding any palpitation symptoms, will send in prescription for metoprolol tartrate 25 mg twice daily. Thank you

## 2022-11-29 ENCOUNTER — Other Ambulatory Visit: Payer: Self-pay | Admitting: Emergency Medicine

## 2022-12-07 ENCOUNTER — Other Ambulatory Visit: Payer: Self-pay

## 2022-12-07 DIAGNOSIS — D860 Sarcoidosis of lung: Secondary | ICD-10-CM | POA: Insufficient documentation

## 2022-12-07 DIAGNOSIS — G589 Mononeuropathy, unspecified: Secondary | ICD-10-CM | POA: Insufficient documentation

## 2022-12-07 DIAGNOSIS — L732 Hidradenitis suppurativa: Secondary | ICD-10-CM | POA: Insufficient documentation

## 2022-12-10 ENCOUNTER — Ambulatory Visit: Payer: 59

## 2022-12-10 VITALS — BP 126/78 | HR 90 | Ht 67.0 in | Wt 161.1 lb

## 2022-12-10 DIAGNOSIS — R0609 Other forms of dyspnea: Secondary | ICD-10-CM

## 2022-12-10 DIAGNOSIS — I4729 Other ventricular tachycardia: Secondary | ICD-10-CM

## 2022-12-10 HISTORY — DX: Other ventricular tachycardia: I47.29

## 2022-12-10 MED ORDER — METOPROLOL TARTRATE 25 MG PO TABS: 12.5000 mg | ORAL_TABLET | Freq: Two times a day (BID) | ORAL | 3 refills | Status: DC

## 2022-12-10 NOTE — Assessment & Plan Note (Addendum)
No obvious cardiac cause. More likely related to her underlying restrictive lung disease. Continue management as per primary care provider and pulmonologist. From cardiac standpoint okay to proceed with exercise as tolerated.

## 2022-12-10 NOTE — Patient Instructions (Signed)
Medication Instructions:  Your physician has recommended you make the following change in your medication:   Start Metoprolol Tartrate 12.5 mg twice daily.  *If you need a refill on your cardiac medications before your next appointment, please call your pharmacy*   Lab Work: None ordered If you have labs (blood work) drawn today and your tests are completely normal, you will receive your results only by: MyChart Message (if you have MyChart) OR A paper copy in the mail If you have any lab test that is abnormal or we need to change your treatment, we will call you to review the results.   Testing/Procedures: None ordered   Follow-Up: At [facility], you and your health needs are our priority.  As part of our continuing mission to provide you with exceptional heart care, we have created designated Provider Care Teams.  These Care Teams include your primary Cardiologist (physician) and Advanced Practice Providers (APPs -  Physician Assistants and Nurse Practitioners) who all work together to provide you with the care you need, when you need it.  We recommend signing up for the patient portal called "MyChart".  Sign up information is provided on this After Visit Summary.  MyChart is used to connect with patients for Virtual Visits (Telemedicine).  Patients are able to view lab/test results, encounter notes, upcoming appointments, etc.  Non-urgent messages can be sent to your provider as well.   To learn more about what you can do with MyChart, go to [email or web address].    Your next appointment:   As needed  The format for your next appointment:   In Person  Provider:   Huntley Dec, MD    Other Instructions none  Important Information About Sugar

## 2022-12-10 NOTE — Progress Notes (Signed)
Cardiology Consultation:    Date:  12/10/2022   ID:  Stefanie Orozco, DOB 01/22/1961, MRN [id number]  PCP:  Andi Devon, MD  Cardiologist:  Marlyn Corporal Aanshi Batchelder, MD   Referring MD: Andi Devon, MD   No chief complaint on file.    ASSESSMENT AND PLAN:   Stefanie Orozco 62 year old woman with history of pulmonary sarcoidosis,, GERD, small hiatal hernia, aortic atherosclerosis noted on CT scan of the chest, no significant coronary atherosclerosis on CT coronary angiogram, cardiac PET/CT study showed no cardiac involvement from sarcoidosis, event monitor with rare short runs of NSVT up to 6 beats in SVT up to 20 beats asymptomatic.  Problem List Items Addressed This Visit     Dyspnea on exertion    No obvious cardiac cause. More likely related to her underlying restrictive lung disease. Continue management as per primary care provider and pulmonologist. From cardiac standpoint okay to proceed with exercise as tolerated.      NSVT (nonsustained ventricular tachycardia) (HCC) - Primary    Asymptomatic short 6 beat run on event monitor 10-04-2022. No significant cardiac structural or functional issues, no coronary artery disease.  Likely related to history of sleep, undiagnosed sleep apnea.  Recommend further evaluation with PCP to have an evaluation for sleep apnea.  Will start empirically low-dose metoprolol tartrate 12.5 mg twice daily.       Relevant Medications   metoprolol tartrate (LOPRESSOR) 25 MG tablet   Return to clinic on an as-needed basis.   History of Present Illness:    Stefanie Orozco is a 62 y.o. female who is being seen today for follow-up, her last visit with [place] in the office was 10-04-2022. PCP is Andi Devon, MD.   Pleasant woman with history of pulmonary sarcoidosis, chronic cough, GERD, small hiatal hernia, aortic atherosclerosis noted on CT scan of the chest with progressive symptoms of dyspnea and chest pain on exertion, underwent further  evaluation for cardiac causes with CT coronary angiogram, echocardiogram, event monitor which showed no significant coronary atherosclerosis, event monitor showed mild abnormalities with rare runs of nonsustained ventricular tachycardia and supraventricular tachycardia.  Cardiac PET CT screening study from 11-07-2022 was also unremarkable showing no significant concerns for sarcoidosis involvement of the myocardium.   Here for the visit today by herself. Mentions she continues to feel short of breath with exertion.  No significant chest pain or palpitation episodes.  Denies any syncopal or near syncopal episodes. Mentions significant sleep disturbances and uses natural supplements to help her sleep.  Also noted to be snoring at night as mentioned by her partner at home.  She reports remote history of sleep apnea that was unremarkable over 10 years ago.  There was mention of obstructive sleep apnea and use of CPAP in the chart previously.  She currently is dealing with mild cough from an upper respiratory infection.  Event monitor results from 10-04-2022 noted predominantly sinus rhythm with average heart rate 83/min.  Rare ventricular and supraventricular ectopy burden less than 1%.  Runs of nonsustained ventricular tachycardia longest 6 beat duration noted.  Short runs of supraventricular tachycardia with the longest episode 20 beat duration lasting 9 seconds.  CT coronary imaging 10/15/2022 noted calcium score 0, no significant coronary atherosclerosis.  Radiologist over read showed no significant extracardiac findings.  Echocardiogram from 10/08/2020 noted normal biventricular function LVEF 60 to 65%, grade 1 diastolic dysfunction with mild left ventricular hypertrophy observed.  Pulmonary function test from November 2024 reviewed shows normal airflow, no significant  response to bronchodilator, lung volumes suggestive of restrictive lung disease, DLCO decreased, but appear normal range when adjusted to  alveolar volume.  CBC from 07-31-2022 hemoglobin 13.7, hematocrit 43, WBC 4.7, platelets 246 CMP BUN 10, creatinine 0.99, EGFR 64, sodium 142, potassium 4, normal transaminases and alkaline phosphatase Lipid panel LDL 125, HDL 72, triglycerides 88, total cholesterol 213  Past Medical History:  Diagnosis Date   Allergic rhinitis 07/21/2018   Chest pain on exertion 10/04/2022   Chronic cough 12/09/2020   Dyspnea on exertion 10/04/2022   GERD 02/20/2007   Qualifier: Diagnosis of   By: Ardell Isaacs RMA, Lisa         Hidradenitis    Pinched nerve in neck    Pulmonary sarcoidosis (HCC)    Right arm pain 07/28/2013   Sarcoid 01/30/1996   ACE> 400, compatible cxr   SARCOIDOSIS, PULMONARY 02/20/2007   Dxed 1998 in [place]  PFT's 03/2010: no obstruction, TLC 4.21 (70%), DLCO 68%  Primarily manifests as cough, controlled with LABA/ICS  PFT's 05/2012:  No obstruction, TLC 3.74 (69% pred), DLCO 65%            Past Surgical History:  Procedure Laterality Date   ABLATION ON ENDOMETRIOSIS     BREAST SURGERY     LAPAROSCOPIC TUBAL LIGATION     laproscopic      Current Medications: Current Meds  Medication Sig   AIRSUPRA 90-80 MCG/ACT AERO Inhale 2 puffs into the lungs 2 (two) times daily.   albuterol (PROVENTIL) (2.5 MG/3ML) 0.083% nebulizer solution Take 3 mLs (2.5 mg total) by nebulization every 6 (six) hours as needed for wheezing or shortness of breath.   albuterol (VENTOLIN HFA) 108 (90 Base) MCG/ACT inhaler INHALE 1 TO 2 PUFFS BY MOUTH EVERY 4 TO 6 HOURS AS NEEDED   benzonatate (TESSALON) 200 MG capsule Take 200 mg by mouth 3 (three) times daily.   famotidine (PEPCID) 40 MG tablet Take 40 mg by mouth daily.   fluticasone (FLONASE) 50 MCG/ACT nasal spray Place 2 sprays into both nostrils daily.   ipratropium (ATROVENT) 0.06 % nasal spray Place 2 sprays into both nostrils 4 (four) times daily.   metoprolol tartrate (LOPRESSOR) 25 MG tablet Take 0.5 tablets (12.5 mg total) by mouth 2 (two)  times daily.   mometasone-formoterol (DULERA) 100-5 MCG/ACT AERO Inhale 2 puffs into the lungs in the morning and at bedtime.   montelukast (SINGULAIR) 10 MG tablet TAKE 1 TABLET BY MOUTH AT BEDTIME   Multiple Vitamin (MULTIVITAMIN WITH MINERALS) TABS tablet Take 1 tablet by mouth daily.   NON FORMULARY Take 1 tablet by mouth 2 (two) times daily. Lidiapincum   omeprazole (PRILOSEC) 40 MG capsule Take 40 mg by mouth daily.     Allergies:   Codeine and Almond oil   Social History   Socioeconomic History   Marital status: Married    Spouse name: Not on file   Number of children: Not on file   Years of education: Not on file   Highest education level: Not on file  Occupational History   Not on file  Tobacco Use   Smoking status: Former    Current packs/day: 0.00    Types: Cigarettes    Quit date: 01/29/1985    Years since quitting: 37.8   Smokeless tobacco: Never  Vaping Use   Vaping status: Never Used  Substance and Sexual Activity   Alcohol use: No   Drug use: No   Sexual activity: Not on file  Other Topics Concern   Not on file  Social History Narrative   Not on file   Social Determinants of Health   Financial Resource Strain: Low Risk  (03/11/2022)   Received from [facility], [facility]   Overall Financial Resource Strain (CARDIA)    Difficulty of Paying Living Expenses: Not hard at all  Food Insecurity: No Food Insecurity (03/11/2022)   Received from [facility], [facility]   Hunger Vital Sign    Worried About Running Out of Food in the Last Year: Never true    Ran Out of Food in the Last Year: Never true  Transportation Needs: No Transportation Needs (03/11/2022)   Received from [facility], [facility]   PRAPARE - Transportation    Lack of Transportation (Medical): No    Lack of Transportation (Non-Medical): No  Physical Activity: Insufficiently Active (03/11/2022)   Received from [facility], [facility]   Exercise Vital Sign    Days of  Exercise per Week: 2 days    Minutes of Exercise per Session: 30 min  Stress: No Stress Concern Present (03/11/2022)   Received from [facility], [facility] of Occupational Health - Occupational Stress Questionnaire    Feeling of Stress : Only a little  Social Connections: Moderately Integrated (03/11/2022)   Received from [facility], [facility]   Social Network    How would you rate your social network (family, work, friends)?: Adequate participation with social networks     Family History: The patient's family history includes Hypertension in her father and mother; Stroke in her mother. ROS:   Please see the history of present illness.    All 14 point review of systems negative except as described per history of present illness.  EKGs/Labs/Other Studies Reviewed:    The following studies were reviewed today:  Narrative & Impression     FDG uptake was not observed. LV perfusion is normal.   Left ventricular function is normal. End diastolic cavity size is normal. End systolic cavity size is normal.   Coronary calcium was absent on the attenuation correction CT images.   FDG uptake findings are inconsistent with active myocardial inflammation/sarcoidosis.   Electronically signed by Epifanio Lesches, MD   CLINICAL DATA:  This over-read does not include interpretation of cardiac or coronary anatomy or pathology. The cardiac PET-CT interpretation by the cardiologist is attached.   COMPARISON:  None Available.   FINDINGS: No suspicious nodules, masses, or infiltrates are identified in the visualized portion of the lungs. No pleural fluid seen.   The visualized portions of the mediastinum and chest wall are unremarkable.   IMPRESSION: No significant non-cardiac abnormality identified. EKG:       Recent Labs: No results found for requested labs within last 365 days.  Recent Lipid Panel No results found for: "CHOL", "TRIG", "HDL", "CHOLHDL",  "VLDL", "LDLCALC", "LDLDIRECT"  Physical Exam:    VS:  BP 126/78   Pulse 90   Ht 5\' 7"  (1.702 m)   Wt 161 lb 1.9 oz (73.1 kg)   SpO2 95%   BMI 25.23 kg/m     Wt Readings from Last 3 Encounters:  12/10/22 161 lb 1.9 oz (73.1 kg)  10/11/22 165 lb (74.8 kg)  10/04/22 165 lb 6.4 oz (75 kg)     GENERAL:  Well nourished, well developed in no acute distress NECK: No JVD; No carotid bruits CARDIAC: RRR, S1 and S2 present, no murmurs, no rubs, no gallops CHEST:  Clear to auscultation without rales, wheezing or rhonchi  Extremities: No pitting pedal edema. Pulses bilaterally symmetric with radial 2+ and dorsalis pedis 2+ NEUROLOGIC:  Alert and oriented x 3  Medication Adjustments/Labs and Tests Ordered: Current medicines are reviewed at length with the patient today.  Concerns regarding medicines are outlined above.  No orders of the defined types were placed in this encounter.  Meds ordered this encounter  Medications   metoprolol tartrate (LOPRESSOR) 25 MG tablet    Sig: Take 0.5 tablets (12.5 mg total) by mouth 2 (two) times daily.    Dispense:  90 tablet    Refill:  3    Signed, Brenisha Tsui reddy Kelton Bultman, MD, MPH, [facility]. 12/10/2022 2:04 PM    [facility]

## 2022-12-10 NOTE — Assessment & Plan Note (Signed)
Asymptomatic short 6 beat run on event monitor 10-04-2022. No significant cardiac structural or functional issues, no coronary artery disease.  Likely related to history of sleep, undiagnosed sleep apnea.  Recommend further evaluation with PCP to have an evaluation for sleep apnea.  Will start empirically low-dose metoprolol tartrate 12.5 mg twice daily.

## 2023-06-14 ENCOUNTER — Other Ambulatory Visit: Payer: Self-pay | Admitting: Emergency Medicine

## 2023-08-14 ENCOUNTER — Other Ambulatory Visit: Payer: Self-pay | Admitting: Emergency Medicine

## 2023-10-29 ENCOUNTER — Other Ambulatory Visit: Payer: Self-pay | Admitting: Emergency Medicine

## 2023-10-29 NOTE — Telephone Encounter (Signed)
 Courtesy refill - pt needs appt for further refills.

## 2023-10-31 ENCOUNTER — Other Ambulatory Visit: Payer: Self-pay | Admitting: Emergency Medicine

## 2023-10-31 NOTE — Telephone Encounter (Signed)
 Pharmacy is stating Dulera  is not covered. Suggested alternatives from the pharmacy were Symbicort , and Advair/Wixela. Would either of these be an appropriate switch?  Please advise, thank you!

## 2023-11-05 MED ORDER — BUDESONIDE-FORMOTEROL FUMARATE 80-4.5 MCG/ACT IN AERO: 2.0000 | INHALATION_SPRAY | Freq: Two times a day (BID) | RESPIRATORY_TRACT | 7 refills | Status: DC

## 2023-11-05 NOTE — Addendum Note (Signed)
 Addended by: Holden Maniscalco M on: 11/05/2023 08:02 AM   Modules accepted: Orders

## 2023-11-20 ENCOUNTER — Other Ambulatory Visit: Payer: Self-pay

## 2023-11-20 MED ORDER — METOPROLOL TARTRATE 25 MG PO TABS
12.5000 mg | ORAL_TABLET | Freq: Two times a day (BID) | ORAL | 0 refills | Status: AC
Start: 2023-11-20 — End: ?

## 2023-11-20 NOTE — Telephone Encounter (Signed)
 Pt's medication was sent to pt's pharmacy as requested. Confirmation received.

## 2023-12-09 ENCOUNTER — Other Ambulatory Visit: Payer: Self-pay | Admitting: Emergency Medicine

## 2024-01-07 ENCOUNTER — Other Ambulatory Visit: Payer: Self-pay

## 2024-01-09 ENCOUNTER — Ambulatory Visit

## 2024-01-09 VITALS — BP 130/90 | HR 71 | Ht 67.0 in | Wt 159.4 lb

## 2024-01-09 DIAGNOSIS — I4729 Other ventricular tachycardia: Secondary | ICD-10-CM | POA: Diagnosis not present

## 2024-01-09 NOTE — Patient Instructions (Signed)

## 2024-01-09 NOTE — Assessment & Plan Note (Signed)
 Short runs of NSVT on event monitor. Otherwise no significant cardiac symptoms.  Empirically being managed on low-dose metoprolol  tartrate 12.5 mg twice daily, tolerating well. From cardiac standpoint continue to follow-up with her PCP regularly and see us  on an as-needed basis.

## 2024-01-09 NOTE — Progress Notes (Signed)
 Cardiology Consultation:    Date:  01/09/2024   ID:  Stefanie Orozco, DOB Feb 09, 1960, MRN [id number]  PCP:  Theo Iha, MD  Cardiologist:  Alean SAUNDERS Yamna Mackel, MD   Referring MD: Theo Iha, MD   No chief complaint on file.    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 Empirically being maintained on low-dose metoprolol .  Transthoracic echocardiogram from 08/12/2023 at [facility] noted LVEF 55%, normal RV size and function. PFTs 03/27/2023 reported mild restriction on lung volumes and moderate DLCO reduction Problem List Items Addressed This Visit       Cardiovascular and Mediastinum   NSVT (nonsustained ventricular tachycardia) (HCC) - Primary   Short runs of NSVT on event monitor. Otherwise no significant cardiac symptoms.  Empirically being managed on low-dose metoprolol  tartrate 12.5 mg twice daily, tolerating well. From cardiac standpoint continue to follow-up with her PCP regularly and see us  on an as-needed basis.       Relevant Orders   EKG 12-Lead (Completed)   Return to clinic with us  for follow-up on an as-needed basis.    History of Present Illness:    Stefanie Orozco is a 63 y.o. female who is being seen today for follow-up visit. PCP is Theo Iha, MD. Last visit with me in the office was 12/10/2022. Pulmonology established care with Dr.Metjian at [facility].  Gentleman here for the visit by himself.  Continues to work in [employer] and works from home most days.  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 Empirically being maintained on low-dose metoprolol .  Transthoracic echocardiogram from 08/12/2023 at [facility] noted LVEF 55%, normal RV size and function. PFTs 03/27/2023 reported mild restriction on lung volumes and moderate DLCO reduction  EKG in the clinic today shows sinus rhythm heart rate 71/min, PR interval 166 ms, QRS duration 74 ms, QTc 402 ms.  No ischemic changes.  Overall mentions that she has been doing well since being off Singulair .  Is closely following up with pulmonologist. Denies any cardiac symptoms of chest pain, palpitation, lightheadedness or dizziness.  Tolerating her low-dose metoprolol  tartrate.   Past Medical History:  Diagnosis Date   Allergic rhinitis 07/21/2018   Arthralgia of hand 10/27/2014   Asthma 1996   Pulmonary Sarcoidosis   Chest pain on exertion 10/04/2022   Chronic cough 12/09/2020   Dyspnea on exertion 10/04/2022   GERD 02/20/2007   Qualifier: Diagnosis of   By: Clair RMA, Lisa         Hidradenitis    NSVT (nonsustained ventricular tachycardia) (HCC) 12/10/2022   Pinched nerve in neck    Pulmonary sarcoidosis    Right arm pain 07/28/2013   Sarcoid 01/30/1996   ACE> 400, compatible  cxr   SARCOIDOSIS, PULMONARY 02/20/2007   Dxed 1998 in [place]  PFT's 03/2010: no obstruction, TLC 4.21 (70%), DLCO 68%  Primarily manifests as cough, controlled with LABA/ICS  PFT's 05/2012:  No obstruction, TLC 3.74 (69% pred), DLCO 65%            Past Surgical History:  Procedure  Laterality Date   ABLATION ON ENDOMETRIOSIS     BREAST SURGERY     LAPAROSCOPIC TUBAL LIGATION     laproscopic      Current Medications: Active Medications[1]   Allergies:   Almond oil, Codeine, and Hydrocodone    Social History   Socioeconomic History   Marital status: Married    Spouse name: Not on file   Number of children: Not on file   Years of education: Not on file   Highest education level: Not on file  Occupational History   Not on file  Tobacco Use   Smoking status: Former    Current packs/day: 0.00    Average packs/day: 0.5 packs/day    Types: Cigarettes    Quit date: 01/29/1985    Years since quitting: 38.9   Smokeless tobacco: Never  Vaping Use   Vaping status: Never Used  Substance and Sexual Activity   Alcohol use: No   Drug use: No   Sexual activity: Not on file  Other Topics Concern   Not on file  Social History Narrative   Not on file   Social Drivers of Health   Tobacco Use: Medium Risk (01/09/2024)   Patient History    Smoking Tobacco Use: Former    Smokeless Tobacco Use: Never    Passive Exposure: Not on file  Financial Resource Strain: Low Risk (03/11/2022)   Received from [facility]   Overall Financial Resource Strain (CARDIA)    Difficulty of Paying Living Expenses: Not hard at all  Food Insecurity: No Food Insecurity (03/11/2022)   Received from [facility]   Epic    Within the past 12 months, you worried that your food would run out before you got the money to buy more.: Never true    Within the past 12 months, the food you bought just didn't last and you didn't have money to get more.: Never true  Transportation Needs: No Transportation Needs (03/11/2022)   Received from [facility] - Transportation    Lack of Transportation (Medical): No    Lack of Transportation (Non-Medical): No  Physical Activity: Insufficiently Active (03/11/2022)   Received from [facility]   Exercise Vital Sign    On average, how many days  per week do you engage in moderate to strenuous exercise (like a brisk walk)?: 2 days    On average, how many minutes do you engage in exercise at this level?: 30 min  Stress: No Stress Concern Present (03/11/2022)   Received from [facility] - Occupational Stress Questionnaire    Feeling of Stress : Only a little  Social Connections: Moderately Integrated (03/11/2022)   Received from [facility]   Social Network    How would you rate your social network (family, work, friends)?: Adequate participation with social networks  Depression (PHQ2-9): Not on file  Alcohol Screen: Not on file  Housing: Not on file  Utilities: Not At Risk (03/11/2022)   Received from [facility] Utilities    Threatened with loss of utilities: No  Health Literacy: Not on file  Family History: The patient's family history includes Hyperlipidemia in her father and mother; Hypertension in her father and mother; Stroke in her mother. ROS:   Please see the history of present illness.    All 14 point review of systems negative except as described per history of present illness.  EKGs/Labs/Other Studies Reviewed:    The following studies were reviewed today:   EKG:  EKG Interpretation Date/Time:  Thursday January 09 2024 13:30:20 EST Ventricular Rate:  71 PR Interval:  166 QRS Duration:  74 QT Interval:  370 QTC Calculation: 402 R Axis:   13  Text Interpretation: Normal sinus rhythm Septal infarct , age undetermined When compared with ECG of 04-Oct-2022 15:13, No significant change was found Confirmed by Liborio Hai reddy [phone]) on 01/09/2024 1:34:43 PM    Recent Labs: No results found for requested labs within last 365 days.  Recent Lipid Panel No results found for: CHOL, TRIG, HDL, CHOLHDL, VLDL, LDLCALC, LDLDIRECT  Physical Exam:    VS:  BP (!) 130/90   Pulse 71   Ht 5' 7 (1.702 m)   Wt 159 lb 6.4 oz (72.3 kg)    SpO2 97%   BMI 24.97 kg/m     Wt Readings from Last 3 Encounters:  01/09/24 159 lb 6.4 oz (72.3 kg)  12/10/22 161 lb 1.9 oz (73.1 kg)  10/11/22 165 lb (74.8 kg)     GENERAL:  Well nourished, well developed in no acute distress NECK: No JVD;  CARDIAC: RRR, S1 and S2 present, no murmurs, no rubs, no gallops CHEST:  Clear to auscultation without rales, wheezing or rhonchi  Extremities: No pitting pedal edema. Pulses bilaterally symmetric with radial 2+ and dorsalis pedis 2+ NEUROLOGIC:  Alert and oriented x 3  Medication Adjustments/Labs and Tests Ordered: Current medicines are reviewed at length with the patient today.  Concerns regarding medicines are outlined above.  Orders Placed This Encounter  Procedures   EKG 12-Lead   No orders of the defined types were placed in this encounter.   Signed, Tyrihanna Wingert reddy Angila Wombles, MD, MPH, [facility]. 01/09/2024 1:48 PM     [facility]     [1]  Current Meds  Medication Sig   albuterol  (PROVENTIL ) (2.5 MG/3ML) 0.083% nebulizer solution INHALE 3 ML BY NEBULIZATION EVERY 6 HOURS AS NEEDED FOR WHEEZING OR SHORTNESS OF BREATH   albuterol  (VENTOLIN  HFA) 108 (90 Base) MCG/ACT inhaler INHALE 1 TO 2 PUFFS BY MOUTH EVERY 4 TO 6 HOURS AS NEEDED   metoprolol  tartrate (LOPRESSOR ) 25 MG tablet Take 0.5 tablets (12.5 mg total) by mouth 2 (two) times daily.   mometasone -formoterol  (DULERA ) 200-5 MCG/ACT AERO Inhale 2 puffs into the lungs.   Multiple Vitamin (MULTIVITAMIN WITH MINERALS) TABS tablet Take 1 tablet by mouth daily.   NON FORMULARY Take 1 tablet by mouth 2 (two) times daily. Lidiapincum   pantoprazole (PROTONIX) 40 MG tablet Take 40 mg by mouth.
